# Patient Record
Sex: Female | Born: 1970 | Race: White | Hispanic: No | Marital: Married | Smoking: Never smoker
Health system: Southern US, Community
[De-identification: ages and names within clinical notes are randomized; demographics above are authoritative.]

## PROBLEM LIST (undated history)

## (undated) DIAGNOSIS — F419 Anxiety disorder, unspecified: Secondary | ICD-10-CM

## (undated) DIAGNOSIS — D649 Anemia, unspecified: Secondary | ICD-10-CM

## (undated) DIAGNOSIS — R635 Abnormal weight gain: Secondary | ICD-10-CM

## (undated) DIAGNOSIS — F329 Major depressive disorder, single episode, unspecified: Secondary | ICD-10-CM

## (undated) DIAGNOSIS — F32A Depression, unspecified: Secondary | ICD-10-CM

## (undated) HISTORY — PX: ABLATION: SHX5711

## (undated) HISTORY — DX: Anxiety disorder, unspecified: F41.9

## (undated) HISTORY — DX: Abnormal weight gain: R63.5

## (undated) HISTORY — PX: KNEE SURGERY: SHX244

## (undated) HISTORY — PX: TONSILLECTOMY AND ADENOIDECTOMY: SUR1326

## (undated) HISTORY — DX: Depression, unspecified: F32.A

## (undated) HISTORY — DX: Major depressive disorder, single episode, unspecified: F32.9

---

## 1998-09-10 ENCOUNTER — Other Ambulatory Visit: Admission: RE | Admit: 1998-09-10 | Discharge: 1998-09-10 | Payer: Self-pay | Admitting: Obstetrics and Gynecology

## 1999-10-01 ENCOUNTER — Inpatient Hospital Stay (HOSPITAL_COMMUNITY): Admission: AD | Admit: 1999-10-01 | Discharge: 1999-10-01 | Payer: Self-pay | Admitting: Obstetrics and Gynecology

## 1999-11-03 ENCOUNTER — Other Ambulatory Visit: Admission: RE | Admit: 1999-11-03 | Discharge: 1999-11-03 | Payer: Self-pay | Admitting: Obstetrics and Gynecology

## 1999-11-25 ENCOUNTER — Ambulatory Visit (HOSPITAL_COMMUNITY): Admission: RE | Admit: 1999-11-25 | Discharge: 1999-11-25 | Payer: Self-pay | Admitting: Obstetrics and Gynecology

## 1999-11-25 ENCOUNTER — Encounter: Payer: Self-pay | Admitting: Obstetrics and Gynecology

## 2000-01-20 ENCOUNTER — Other Ambulatory Visit: Admission: RE | Admit: 2000-01-20 | Discharge: 2000-01-20 | Payer: Self-pay | Admitting: Obstetrics and Gynecology

## 2000-08-03 ENCOUNTER — Inpatient Hospital Stay (HOSPITAL_COMMUNITY): Admission: AD | Admit: 2000-08-03 | Discharge: 2000-08-06 | Payer: Self-pay | Admitting: Obstetrics and Gynecology

## 2000-09-13 ENCOUNTER — Other Ambulatory Visit: Admission: RE | Admit: 2000-09-13 | Discharge: 2000-09-13 | Payer: Self-pay | Admitting: Obstetrics and Gynecology

## 2002-10-16 ENCOUNTER — Other Ambulatory Visit: Admission: RE | Admit: 2002-10-16 | Discharge: 2002-10-16 | Payer: Self-pay | Admitting: Obstetrics and Gynecology

## 2003-10-27 ENCOUNTER — Other Ambulatory Visit: Admission: RE | Admit: 2003-10-27 | Discharge: 2003-10-27 | Payer: Self-pay | Admitting: Obstetrics and Gynecology

## 2003-10-30 ENCOUNTER — Encounter: Admission: RE | Admit: 2003-10-30 | Discharge: 2003-10-30 | Payer: Self-pay | Admitting: Obstetrics and Gynecology

## 2004-08-03 ENCOUNTER — Ambulatory Visit: Payer: Self-pay

## 2004-09-01 ENCOUNTER — Encounter: Payer: Self-pay | Admitting: General Practice

## 2004-09-08 ENCOUNTER — Encounter: Payer: Self-pay | Admitting: General Practice

## 2004-10-06 ENCOUNTER — Encounter: Payer: Self-pay | Admitting: General Practice

## 2004-10-12 ENCOUNTER — Other Ambulatory Visit: Payer: Self-pay

## 2004-11-06 ENCOUNTER — Encounter: Payer: Self-pay | Admitting: General Practice

## 2004-12-01 ENCOUNTER — Other Ambulatory Visit: Admission: RE | Admit: 2004-12-01 | Discharge: 2004-12-01 | Payer: Self-pay | Admitting: Pain Medicine

## 2005-07-31 ENCOUNTER — Emergency Department: Payer: Self-pay | Admitting: Emergency Medicine

## 2005-07-31 ENCOUNTER — Other Ambulatory Visit: Payer: Self-pay

## 2006-09-18 ENCOUNTER — Ambulatory Visit: Payer: Self-pay | Admitting: Physician Assistant

## 2007-04-12 ENCOUNTER — Ambulatory Visit: Payer: Self-pay | Admitting: Internal Medicine

## 2007-04-27 ENCOUNTER — Ambulatory Visit: Payer: Self-pay | Admitting: Internal Medicine

## 2008-03-19 ENCOUNTER — Ambulatory Visit: Payer: Self-pay | Admitting: Obstetrics and Gynecology

## 2008-09-15 ENCOUNTER — Ambulatory Visit: Payer: Self-pay | Admitting: General Surgery

## 2009-03-24 ENCOUNTER — Ambulatory Visit: Payer: Self-pay | Admitting: General Surgery

## 2009-12-31 ENCOUNTER — Ambulatory Visit: Payer: Self-pay | Admitting: Internal Medicine

## 2010-04-08 ENCOUNTER — Ambulatory Visit: Payer: Self-pay | Admitting: General Surgery

## 2010-04-15 ENCOUNTER — Ambulatory Visit: Payer: Self-pay | Admitting: General Surgery

## 2011-04-19 ENCOUNTER — Ambulatory Visit (INDEPENDENT_AMBULATORY_CARE_PROVIDER_SITE_OTHER): Payer: PRIVATE HEALTH INSURANCE | Admitting: Internal Medicine

## 2011-04-19 ENCOUNTER — Encounter: Payer: Self-pay | Admitting: Internal Medicine

## 2011-04-19 VITALS — BP 110/72 | HR 79 | Temp 97.8°F | Resp 16 | Ht 63.75 in | Wt 182.0 lb

## 2011-04-19 DIAGNOSIS — E663 Overweight: Secondary | ICD-10-CM

## 2011-04-19 DIAGNOSIS — R5383 Other fatigue: Secondary | ICD-10-CM

## 2011-04-19 DIAGNOSIS — E669 Obesity, unspecified: Secondary | ICD-10-CM

## 2011-04-19 DIAGNOSIS — Z1239 Encounter for other screening for malignant neoplasm of breast: Secondary | ICD-10-CM

## 2011-04-19 DIAGNOSIS — R5381 Other malaise: Secondary | ICD-10-CM

## 2011-04-19 MED ORDER — CYANOCOBALAMIN 1000 MCG/ML IJ SOLN: 1000.0000 ug | INTRAMUSCULAR | Status: DC

## 2011-04-19 MED ORDER — SYRINGE (DISPOSABLE) 1 ML MISC: Status: DC

## 2011-04-19 MED ORDER — CYANOCOBALAMIN 1000 MCG/ML IJ SOLN
1000.0000 ug | Freq: Once | INTRAMUSCULAR | Status: AC
  Administered 2011-04-19: 1000 ug via INTRAMUSCULAR

## 2011-04-19 MED ORDER — PHENTERMINE HCL 37.5 MG PO TABS: 37.5000 mg | ORAL_TABLET | Freq: Every day | ORAL | Status: DC

## 2011-04-20 LAB — VITAMIN B12: Vitamin B-12: 181 pg/mL — ABNORMAL LOW (ref 211–911)

## 2011-04-21 ENCOUNTER — Encounter: Payer: Self-pay | Admitting: Internal Medicine

## 2011-04-21 DIAGNOSIS — Z1239 Encounter for other screening for malignant neoplasm of breast: Secondary | ICD-10-CM | POA: Insufficient documentation

## 2011-04-21 DIAGNOSIS — R5383 Other fatigue: Secondary | ICD-10-CM | POA: Insufficient documentation

## 2011-04-21 DIAGNOSIS — E669 Obesity, unspecified: Secondary | ICD-10-CM | POA: Insufficient documentation

## 2011-04-21 DIAGNOSIS — Z7689 Persons encountering health services in other specified circumstances: Secondary | ICD-10-CM | POA: Insufficient documentation

## 2011-04-21 NOTE — Assessment & Plan Note (Signed)
she is receiving regular screening through her gynecologists office.

## 2011-04-21 NOTE — Progress Notes (Signed)
  Subjective:    Patient ID: Sheri Guerrero, female    DOB: Jul 28, 1971, 40 y.o.   MRN: [id number]  HPI 40 yo white female with a history of depressive disorder, recently lost her mother to [place],  Presents with weight gain and increased depressive symptoms including malaise and insomnia.  She has in the past lost 60 lbs through diet and exercising and is currently doing both but not having the desired weight loss compared to previous time. She is interested in trying g B12 injections for energy and trying  an appetite suppressant which was prescribed in the past but never taken.  Review of Systems  Constitutional: Positive for appetite change, fatigue and unexpected weight change. Negative for fever and chills.  HENT: Negative for hearing loss, ear pain, nosebleeds, congestion, sore throat, facial swelling, rhinorrhea, sneezing, mouth sores, trouble swallowing, neck pain, neck stiffness, voice change, postnasal drip, sinus pressure, tinnitus and ear discharge.   Eyes: Negative for pain, discharge, redness and visual disturbance.  Respiratory: Negative for cough, chest tightness, shortness of breath, wheezing and stridor.   Cardiovascular: Negative for chest pain, palpitations and leg swelling.  Musculoskeletal: Negative for myalgias and arthralgias.  Skin: Negative for color change and rash.  Neurological: Negative for dizziness, weakness, light-headedness and headaches.  Hematological: Negative for adenopathy.  Psychiatric/Behavioral: Positive for sleep disturbance.   BP 110/72  Pulse 79  Temp(Src) 97.8 F (36.6 C) (Oral)  Resp 16  Ht 5' 3.75" (1.619 m)  Wt 182 lb (82.555 kg)  BMI 31.49 kg/m2  SpO2 99%  LMP 04/11/2011     Objective:   Physical Exam  Constitutional: She is oriented to person, place, and time. She appears well-developed and well-nourished.  HENT:  Mouth/Throat: Oropharynx is clear and moist.  Eyes: EOM are normal. Pupils are equal, round, and reactive to light. No  scleral icterus.  Neck: Normal range of motion. Neck supple. No JVD present. No thyromegaly present.  Cardiovascular: Normal rate, regular rhythm, normal heart sounds and intact distal pulses.   Pulmonary/Chest: Effort normal and breath sounds normal.  Abdominal: Soft. Bowel sounds are normal. She exhibits no mass. There is no tenderness.  Musculoskeletal: Normal range of motion. She exhibits no edema.  Lymphadenopathy:    She has no cervical adenopathy.  Neurological: She is alert and oriented to person, place, and time.  Skin: Skin is warm and dry.  Psychiatric: She has a normal mood and affect.          Assessment & Plan:

## 2011-04-21 NOTE — Assessment & Plan Note (Addendum)
Checking B12 and TSH.  No history of anemia.  If labs are normal she will consider resuming citalopram but currently not interested bc of the weight gain.

## 2011-04-21 NOTE — Assessment & Plan Note (Signed)
Failure to lose weight despite exercise due to increased appetite and fatigue. 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  Will check thyroid and b12 and initiate treatment as well as a short trial of phentermine.  Risks and benefits of stimulant medications discussed.

## 2011-04-26 ENCOUNTER — Ambulatory Visit: Payer: PRIVATE HEALTH INSURANCE

## 2011-05-03 ENCOUNTER — Ambulatory Visit (INDEPENDENT_AMBULATORY_CARE_PROVIDER_SITE_OTHER): Payer: PRIVATE HEALTH INSURANCE | Admitting: Internal Medicine

## 2011-05-03 DIAGNOSIS — E538 Deficiency of other specified B group vitamins: Secondary | ICD-10-CM

## 2011-05-03 MED ORDER — CYANOCOBALAMIN 1000 MCG/ML IJ SOLN: 1000.0000 ug | INTRAMUSCULAR | Status: DC

## 2011-05-03 MED ORDER — CYANOCOBALAMIN 1000 MCG/ML IJ SOLN
1000.0000 ug | Freq: Once | INTRAMUSCULAR | Status: AC
  Administered 2011-05-03: 1000 ug via INTRAMUSCULAR

## 2011-05-18 ENCOUNTER — Other Ambulatory Visit: Payer: Self-pay | Admitting: Internal Medicine

## 2011-05-19 ENCOUNTER — Encounter: Payer: Self-pay | Admitting: Internal Medicine

## 2011-05-19 MED ORDER — CITALOPRAM HYDROBROMIDE 10 MG PO TABS: 10.0000 mg | ORAL_TABLET | Freq: Every day | ORAL | Status: DC

## 2011-09-13 ENCOUNTER — Ambulatory Visit: Payer: Self-pay | Admitting: Physician Assistant

## 2011-09-21 ENCOUNTER — Other Ambulatory Visit: Payer: Self-pay | Admitting: Physician Assistant

## 2012-02-02 ENCOUNTER — Ambulatory Visit: Payer: PRIVATE HEALTH INSURANCE | Admitting: Internal Medicine

## 2012-03-16 ENCOUNTER — Other Ambulatory Visit: Payer: PRIVATE HEALTH INSURANCE

## 2012-03-26 ENCOUNTER — Encounter: Payer: Self-pay | Admitting: Internal Medicine

## 2012-03-26 ENCOUNTER — Ambulatory Visit (INDEPENDENT_AMBULATORY_CARE_PROVIDER_SITE_OTHER): Payer: PRIVATE HEALTH INSURANCE | Admitting: Internal Medicine

## 2012-03-26 VITALS — BP 120/70 | HR 86 | Temp 98.0°F | Resp 16 | Wt 185.2 lb

## 2012-03-26 DIAGNOSIS — E669 Obesity, unspecified: Secondary | ICD-10-CM

## 2012-03-26 DIAGNOSIS — K2990 Gastroduodenitis, unspecified, without bleeding: Secondary | ICD-10-CM

## 2012-03-26 DIAGNOSIS — E663 Overweight: Secondary | ICD-10-CM

## 2012-03-26 DIAGNOSIS — K2970 Gastritis, unspecified, without bleeding: Secondary | ICD-10-CM | POA: Insufficient documentation

## 2012-03-26 MED ORDER — PHENTERMINE HCL 37.5 MG PO TABS: 37.5000 mg | ORAL_TABLET | Freq: Every day | ORAL | Status: DC

## 2012-03-26 MED ORDER — DEXLANSOPRAZOLE 60 MG PO CPDR: 60.0000 mg | DELAYED_RELEASE_CAPSULE | Freq: Every day | ORAL | Status: DC

## 2012-03-26 NOTE — Assessment & Plan Note (Signed)
She had prior success with phentermine and is requesting brief resuming of medication to get started again.

## 2012-03-26 NOTE — Patient Instructions (Signed)
Abstain from all nonsteroidals and alcohol for two weeks (Advil,Alleve, celebrex, meloxicam, etc)  Ok to use gaviscon or mylanta for immediate relief of burning

## 2012-03-26 NOTE — Assessment & Plan Note (Signed)
NSAIDs vs H Pylori.  Serologies pending,  Trial of Dexilant, no NSAIDs or alcohol .  continue PPI for 6 weeks

## 2012-03-26 NOTE — Progress Notes (Signed)
Patient ID: Sheri Guerrero, female   DOB: 1970-08-31, 41 y.o.   MRN: [id number]  Patient Active Problem List  Diagnosis  . Obesity  . Fatigue  . Screening for breast cancer  . Gastritis    Subjective:  CC:   Chief Complaint  Patient presents with  . Follow-up  . Abdominal Pain    HPI:   Sheri Guerrero a 41 y.o. female who presents with recurrence  of gastritis sympotms for 4 months,  Resumed after resuming wellbutrin but also taking ibuprofren 800 mg every 1 or 2 days.    Stools brown.  Pain worst when stomach empty ,  No  specific food triggers.   2) had a  hypoglemic event sugar was 45 during recent  workout. Had not eaten that morning.     Past Medical History  Diagnosis Date  . Anxiety   . Abnormal weight gain   . Depression     Past Surgical History  Procedure Date  . Tonsillectomy and adenoidectomy age 65         The following portions of the patient's history were reviewed and updated as appropriate: Allergies, current medications, and problem list.    Review of Systems:   12 Pt  review of systems was negative except those addressed in the HPI,     History   Social History  . Marital Status: Married    Spouse Name: N/A    Number of Children: N/A  . Years of Education: N/A   Occupational History  . PT at [employer]     FT caregiver for parents   Social History Main Topics  . Smoking status: Never Smoker   . Smokeless tobacco: Never Used  . Alcohol Use: Yes     occassional, one a week  . Drug Use: No  . Sexually Active: Not on file   Other Topics Concern  . Not on file   Social History Narrative   Lives with spouse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    Objective:  BP 120/70  Pulse 86  Temp 98 F (36.7 C) (Oral)  Resp 16  Wt 185 lb 4 oz (84.029 kg)  SpO2 96%  LMP 03/12/2012  General appearance: alert, cooperative and appears stated age Ears: normal TM's and external ear canals both ears Throat: lips, mucosa, and tongue  normal; teeth and gums normal Neck: no adenopathy, no carotid bruit, supple, symmetrical, trachea midline and thyroid not enlarged, symmetric, no tenderness/mass/nodules Back: symmetric, no curvature. ROM normal. No CVA tenderness. Lungs: clear to auscultation bilaterally Heart: regular rate and rhythm, S1, S2 normal, no murmur, click, rub or gallop Abdomen: soft, tender in mid epigastric region without rebound or guarding; bowel sounds normal; no masses,  no organomegaly Pulses: 2+ and symmetric Skin: Skin color, texture, turgor normal. No rashes or lesions Lymph nodes: Cervical, supraclavicular, and axillary nodes normal.  Assessment and Plan: Gastritis NSAIDs vs H Pylori.  Serologies pending,  Trial of Dexilant, no NSAIDs or alcohol .  continue PPI for 6 weeks  Obesity She had prior success with phentermine and is requesting brief resuming of medication to get started again.    Updated Medication List Outpatient Encounter Prescriptions as of 03/26/2012  Medication Sig Dispense Refill  . cyanocobalamin (,VITAMIN B-12,) 1000 MCG/ML injection Inject 1 mL (1,000 mcg total) into the muscle every 30 (thirty) days.  10 mL  3  . doxycycline (VIBRAMYCIN) 100 MG capsule Take 100 mg by mouth daily.        Marland Kitchen  Multiple Vitamin (MULTIVITAMIN) tablet Take 1 tablet by mouth daily.        . Syringe, Disposable, 1 ML MISC Use new syringe with each B12 injection  30 each  0  . dexlansoprazole (DEXILANT) 60 MG capsule Take 1 capsule (60 mg total) by mouth daily.  30 capsule  2  . phentermine (ADIPEX-P) 37.5 MG tablet Take 1 tablet (37.5 mg total) by mouth daily before breakfast.  30 tablet  2  . DISCONTD: citalopram (CELEXA) 10 MG tablet Take 1 tablet (10 mg total) by mouth daily.  30 tablet  3  . DISCONTD: phentermine (ADIPEX-P) 37.5 MG tablet Take 1 tablet (37.5 mg total) by mouth daily before breakfast.  30 tablet  2

## 2012-03-27 ENCOUNTER — Other Ambulatory Visit: Payer: Self-pay | Admitting: Internal Medicine

## 2012-03-27 LAB — CBC WITH DIFFERENTIAL/PLATELET
Basophil #: 0 10*3/uL (ref 0.0–0.1)
Basophil %: 0.6 %
HCT: 36.5 % (ref 35.0–47.0)
MCH: 30.7 pg (ref 26.0–34.0)
MCHC: 34.2 g/dL (ref 32.0–36.0)
MCV: 90 fL (ref 80–100)
Monocyte #: 0.5 x10 3/mm (ref 0.2–0.9)
Monocyte %: 8 %
Neutrophil %: 60.5 %
Platelet: 207 10*3/uL (ref 150–440)
RBC: 4.07 10*6/uL (ref 3.80–5.20)

## 2012-03-27 LAB — LIPASE, BLOOD: Lipase: 173 U/L (ref 73–393)

## 2012-03-27 LAB — TSH: Thyroid Stimulating Horm: 0.907 u[IU]/mL

## 2012-03-28 ENCOUNTER — Telehealth: Payer: Self-pay | Admitting: Internal Medicine

## 2012-03-28 NOTE — Telephone Encounter (Signed)
Thyroid , cbc and hgba1c are all normal,.  No signs of diabetes, anemia or thyroid problems

## 2012-03-29 NOTE — Telephone Encounter (Signed)
Patient notified

## 2012-03-30 ENCOUNTER — Other Ambulatory Visit: Payer: Self-pay | Admitting: Internal Medicine

## 2012-04-06 ENCOUNTER — Encounter: Payer: Self-pay | Admitting: Internal Medicine

## 2012-05-16 ENCOUNTER — Ambulatory Visit: Payer: Self-pay | Admitting: General Practice

## 2012-06-06 ENCOUNTER — Telehealth: Payer: Self-pay | Admitting: Internal Medicine

## 2012-06-06 MED ORDER — CYANOCOBALAMIN 1000 MCG/ML IJ SOLN: 1000.0000 ug | INTRAMUSCULAR | Status: DC

## 2012-06-06 NOTE — Telephone Encounter (Signed)
Refill request for cyanocobalamin 1,000 mcg/10 ml  Sig: inject 1 ml (1,000 mcg total) into the muscle every 30 days

## 2012-06-06 NOTE — Telephone Encounter (Signed)
Sent to [facility] 

## 2013-02-04 ENCOUNTER — Encounter: Payer: Self-pay | Admitting: Internal Medicine

## 2013-02-04 ENCOUNTER — Ambulatory Visit (INDEPENDENT_AMBULATORY_CARE_PROVIDER_SITE_OTHER): Payer: 59 | Admitting: Internal Medicine

## 2013-02-04 VITALS — BP 102/66 | HR 74 | Temp 98.3°F | Resp 16 | Wt 185.8 lb

## 2013-02-04 DIAGNOSIS — E669 Obesity, unspecified: Secondary | ICD-10-CM

## 2013-02-04 DIAGNOSIS — E663 Overweight: Secondary | ICD-10-CM

## 2013-02-04 MED ORDER — PHENTERMINE HCL 37.5 MG PO TABS: 37.5000 mg | ORAL_TABLET | Freq: Every day | ORAL | Status: DC

## 2013-02-04 MED ORDER — CYANOCOBALAMIN 1000 MCG/ML IJ SOLN: 1000.0000 ug | INTRAMUSCULAR | Status: DC

## 2013-02-04 MED ORDER — DOXYCYCLINE HYCLATE 100 MG PO CAPS: 100.0000 mg | ORAL_CAPSULE | Freq: Every day | ORAL | Status: DC

## 2013-02-04 NOTE — Patient Instructions (Addendum)

## 2013-02-04 NOTE — Assessment & Plan Note (Addendum)
I have addressed  BMI and recommended a low glycemic index diet utilizing smaller more frequent meals to increase metabolism.  I have also recommended that patient start exercising with a goal of 30 minutes of aerobic exercise a minimum of 5 days per week. 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 Phentermine trial agreed upon.

## 2013-02-04 NOTE — Progress Notes (Signed)
Patient ID: Sheri Guerrero, female   DOB: [birth year], 42 y.o.   MRN: [id number]   Patient Active Problem List   Diagnosis Date Noted  . Gastritis 03/26/2012  . Obesity 04/21/2011  . Fatigue 04/21/2011  . Screening for breast cancer 04/21/2011    Subjective:  CC:   Chief Complaint  Patient presents with  . Follow-up    labs B12 C/O fatigue,    HPI:   Sheri Guerrero a 42 y.o. female who presents Follow up on fatigue, overweight and gastritis.  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  Sleeping better, but has been experiencing mid afternoon headaches daily for the past 3 weeks,  accompanied by sleepiness.  She has been skipping meals, not exercising and gaining weight.  She has recently addressed her lifestyle and has resumed a high protein diet .  She has increased appetite from 3 t o 7 pm and is requesting a trial of phentermine .    Past Medical History  Diagnosis Date  . Anxiety   . Abnormal weight gain   . Depression     Past Surgical History  Procedure Laterality Date  . Tonsillectomy and adenoidectomy  age 89       The following portions of the patient's history were reviewed and updated as appropriate: Allergies, current medications, and problem list.    Review of Systems:   12 Pt  review of systems was negative except those addressed in the HPI,     History   Social History  . Marital Status: Married    Spouse Name: N/A    Number of Children: N/A  . Years of Education: N/A   Occupational History  . PT at [employer]     FT caregiver for parents   Social History Main Topics  . Smoking status: Never Smoker   . Smokeless tobacco: Never Used  . Alcohol Use: Yes     Comment: occassional, one a week  . Drug Use: No  . Sexually Active: Not on file   Other Topics Concern  . Not on file   Social History Narrative   Lives with spouse   FT caregiver for parents who live  next door    Objective:  BP 102/66  Pulse 74  Temp(Src) 98.3 F (36.8 C) (Oral)  Resp 16  Wt 185 lb 12 oz (84.256 kg)  BMI 32.14 kg/m2  SpO2 99%  LMP 01/31/2013  General appearance: alert, cooperative and appears stated age Ears: normal TM's and external ear canals both ears Throat: lips, mucosa, and tongue normal; teeth and gums normal Neck: no adenopathy, no carotid bruit, supple, symmetrical, trachea midline and thyroid not enlarged, symmetric, no tenderness/mass/nodules Back: symmetric, no curvature. ROM normal. No CVA tenderness. Lungs: clear to auscultation bilaterally Heart: regular rate and rhythm, S1, S2 normal, no murmur, click, rub or gallop Abdomen: soft, non-tender; bowel sounds normal; no masses,  no organomegaly Pulses: 2+ and symmetric Skin: Skin color, texture, turgor normal. No rashes or lesions Lymph nodes: Cervical, supraclavicular, and axillary nodes normal.  Assessment and Plan:  Obesity I have addressed  BMI and recommended a low glycemic index diet utilizing smaller more frequent meals to increase metabolism.  I have also recommended that patient start exercising with a goal of 30 minutes of aerobic exercise a minimum of 5 days per week. 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  Phentermine trial agreed upon.    A total of 30 minutes of face to face time was spent with patient more than half of which was spent in counselling and coordination of care    Updated Medication List Outpatient Encounter Prescriptions as of 02/04/2013  Medication Sig Dispense Refill  . cyanocobalamin (,VITAMIN B-12,) 1000 MCG/ML injection Inject 1 mL (1,000 mcg total) into the muscle every 30 (thirty) days.  10 mL  3  . Syringe, Disposable, 1 ML MISC Use new syringe with each B12 injection  30 each  0  . [DISCONTINUED] cyanocobalamin (,VITAMIN B-12,) 1000 MCG/ML injection Inject 1 mL (1,000 mcg total) into the muscle every 30 (thirty) days.  10 mL  3  . dexlansoprazole  (DEXILANT) 60 MG capsule Take 1 capsule (60 mg total) by mouth daily.  30 capsule  2  . doxycycline (VIBRAMYCIN) 100 MG capsule Take 1 capsule (100 mg total) by mouth daily.  14 capsule  1  . Multiple Vitamin (MULTIVITAMIN) tablet Take 1 tablet by mouth daily.        . phentermine (ADIPEX-P) 37.5 MG tablet Take 1 tablet (37.5 mg total) by mouth daily before breakfast.  30 tablet  2  . phentermine (ADIPEX-P) 37.5 MG tablet Take 1 tablet (37.5 mg total) by mouth daily before breakfast.  30 tablet  2  . [DISCONTINUED] doxycycline (VIBRAMYCIN) 100 MG capsule Take 100 mg by mouth daily.        . [DISCONTINUED] phentermine (ADIPEX-P) 37.5 MG tablet Take 1 tablet (37.5 mg total) by mouth daily before breakfast.  30 tablet  2  . [DISCONTINUED] phentermine (ADIPEX-P) 37.5 MG tablet Take 1 tablet (37.5 mg total) by mouth daily before breakfast.  30 tablet  2   No facility-administered encounter medications on file as of 02/04/2013.     No orders of the defined types were placed in this encounter.    No Follow-up on file.

## 2013-04-29 ENCOUNTER — Ambulatory Visit (INDEPENDENT_AMBULATORY_CARE_PROVIDER_SITE_OTHER): Payer: 59 | Admitting: Adult Health

## 2013-04-29 ENCOUNTER — Encounter: Payer: Self-pay | Admitting: Adult Health

## 2013-04-29 VITALS — BP 110/68 | HR 84 | Temp 98.4°F | Resp 12 | Wt 184.5 lb

## 2013-04-29 DIAGNOSIS — E669 Obesity, unspecified: Secondary | ICD-10-CM

## 2013-04-29 DIAGNOSIS — J329 Chronic sinusitis, unspecified: Secondary | ICD-10-CM

## 2013-04-29 DIAGNOSIS — E663 Overweight: Secondary | ICD-10-CM

## 2013-04-29 MED ORDER — LEVOFLOXACIN 500 MG PO TABS: 500.0000 mg | ORAL_TABLET | Freq: Every day | ORAL | Status: DC

## 2013-04-29 MED ORDER — PHENTERMINE HCL 37.5 MG PO TABS: 37.5000 mg | ORAL_TABLET | Freq: Every day | ORAL | Status: DC

## 2013-04-29 MED ORDER — FLUCONAZOLE 150 MG PO TABS: 150.0000 mg | ORAL_TABLET | Freq: Once | ORAL | Status: DC

## 2013-04-29 NOTE — Progress Notes (Signed)
  Subjective:    Patient ID: Sheri Guerrero, female    DOB: 02/03/1971, 42 y.o.   MRN: [id number]  HPI  Pt reports sinus pressure and cough x 5 weeks.  Originally had productive cough with yellow sputum, sinus pressure, and fatigue for 2 weeks before seeing a NP at [facility].  Was started on Augmentin bid x 7 days which she completed. Pt states she felt some improvement but not complete symptom resolution.  [place] and began feeling worse when there.  Pt continues to have cough but now nonproductive, nasal congestion, and sore throat.  Pt reports history of pneumonia.   Also, patient reports that she had been prescribed phentermine in the past however she never took the medication. She is ready to begin a weight loss program and is requesting prescription for phentermine. She will begin this once her current symptoms resolve.   Current Outpatient Prescriptions on File Prior to Visit  Medication Sig Dispense Refill  . cyanocobalamin (,VITAMIN B-12,) 1000 MCG/ML injection Inject 1 mL (1,000 mcg total) into the muscle every 30 (thirty) days.  10 mL  3  . Multiple Vitamin (MULTIVITAMIN) tablet Take 1 tablet by mouth daily.        . Syringe, Disposable, 1 ML MISC Use new syringe with each B12 injection  30 each  0   No current facility-administered medications on file prior to visit.      Review of Systems  Constitutional: Positive for chills and fatigue.       Reports chills, unsure if she's had fever.  HENT: Positive for congestion, rhinorrhea and sinus pressure.   Eyes: Negative.   Respiratory: Positive for cough.   Cardiovascular: Negative.   Gastrointestinal: Positive for nausea.  Skin: Negative.   Neurological: Negative.   Psychiatric/Behavioral: Negative.        Objective:   Physical Exam  Constitutional: She appears well-developed and well-nourished.  HENT:  Head: Normocephalic.  Mouth/Throat: Oropharynx is clear and moist.  Neck:  Normal range of motion.  Cardiovascular: Normal rate.   Pulmonary/Chest: Effort normal and breath sounds normal.  Skin: Skin is warm and dry.  Psychiatric: She has a normal mood and affect. Her behavior is normal. Judgment and thought content normal.      Assessment & Plan:

## 2013-04-29 NOTE — Patient Instructions (Addendum)
Feel better soon!

## 2013-04-29 NOTE — Assessment & Plan Note (Signed)
Sinus symptoms ongoing for greater than 5 weeks. Completed course of Augmentin without complete resolution of symptoms. Start Levaquin 500 mg daily x10 days. RTC if no improvement in symptoms within 3-4 days at which point consider chest x-ray.

## 2013-04-29 NOTE — Assessment & Plan Note (Addendum)
Patient is requesting prescription for phentermine. This was ordered in the past however she never took medication. Phentermine ordered. She plans on beginning weight loss program once her current symptoms of sinusitis and chest congestion resolved.

## 2013-09-15 ENCOUNTER — Emergency Department: Payer: Self-pay | Admitting: Emergency Medicine

## 2013-10-10 ENCOUNTER — Ambulatory Visit: Payer: Self-pay | Admitting: General Practice

## 2013-10-15 ENCOUNTER — Ambulatory Visit: Payer: Self-pay | Admitting: General Practice

## 2014-04-18 LAB — LIPID PANEL
Cholesterol: 173 mg/dL (ref 0–200)
HDL: 48 mg/dL (ref 35–70)
LDL Cholesterol: 91 mg/dL

## 2014-05-06 ENCOUNTER — Encounter: Payer: Self-pay | Admitting: Internal Medicine

## 2014-05-06 ENCOUNTER — Ambulatory Visit (INDEPENDENT_AMBULATORY_CARE_PROVIDER_SITE_OTHER): Payer: 59 | Admitting: Internal Medicine

## 2014-05-06 VITALS — BP 108/76 | HR 78 | Temp 98.3°F | Resp 16 | Ht 64.0 in | Wt 189.0 lb

## 2014-05-06 DIAGNOSIS — E538 Deficiency of other specified B group vitamins: Secondary | ICD-10-CM

## 2014-05-06 DIAGNOSIS — E669 Obesity, unspecified: Secondary | ICD-10-CM

## 2014-05-06 DIAGNOSIS — Z23 Encounter for immunization: Secondary | ICD-10-CM

## 2014-05-06 DIAGNOSIS — R5381 Other malaise: Secondary | ICD-10-CM

## 2014-05-06 DIAGNOSIS — E663 Overweight: Secondary | ICD-10-CM

## 2014-05-06 DIAGNOSIS — R5383 Other fatigue: Secondary | ICD-10-CM

## 2014-05-06 MED ORDER — SYRINGE (DISPOSABLE) 1 ML MISC: Status: DC

## 2014-05-06 MED ORDER — CYANOCOBALAMIN 1000 MCG/ML IJ SOLN: INTRAMUSCULAR | Status: DC

## 2014-05-06 MED ORDER — PHENTERMINE HCL 37.5 MG PO TABS: ORAL_TABLET | ORAL | Status: DC

## 2014-05-06 NOTE — Patient Instructions (Signed)
I have authorized the use of phentermine for 3 months.  Please have your vital signs checked a week after starting, and return to see me in 3 months.tkae 1//2 tablet in the morning,  The second half by 2 PM to avoid insomnia If you have not lost  Lbs  by the end of the  3 months we will have to discontinue it.

## 2014-05-06 NOTE — Assessment & Plan Note (Signed)
I have addressed  BMI and recommended wt loss of 10% of body weigh over the next 6 months using a low glycemic index diet and regular exercise a minimum of 5 days per week. She has had difficulty losing weight due to increased appetite and is requesting a trial of  Phentermine.  She is aware of the possible side effects and risks and understands that    The medication will be discontinued if she has not lost 5% of her body weight over the next 3 months, which , based on today's weight is  9 lbs.  

## 2014-05-06 NOTE — Progress Notes (Signed)
Patient ID: Sheri Guerrero, female   DOB: 10/10/1970, 43 y.o.   MRN: [id number]   Patient Active Problem List   Diagnosis Date Noted  . B12 deficiency 05/06/2014  . Gastritis 03/26/2012  . Obesity 04/21/2011  . Fatigue 04/21/2011  . Screening for breast cancer 04/21/2011    Subjective:  CC:   Chief Complaint  Patient presents with  . Follow-up    B 12  medication refills    HPI:   Sheri Guerrero is a 43 y.o. female who presents for Follow up on chronic conditions, including B12 deficiency and obesity.   Obesity.   Recent health screening was done and patient was advised to follow up with me.  She was last seen a year ago and has gained 5 lbs.  She has been prescribed phentermine in the past but did not use it consistently.  She feels good and exercises regularlly but has an increased appetite.   B12 deficiency,  Diagnosed in 2012.  Patient notes that after 3 weeks she develops a feelig of head heaviness that resolves when she self administers B12. Refills needed,  Wondering if she can increase the frequency.    Past Medical History  Diagnosis Date  . Anxiety   . Abnormal weight gain   . Depression     Past Surgical History  Procedure Laterality Date  . Tonsillectomy and adenoidectomy  age 43       The following portions of the patient's history were reviewed and updated as appropriate: Allergies, current medications, and problem list.    Review of Systems:   Patient denies headache, fevers, malaise, unintentional weight loss, skin rash, eye pain, sinus congestion and sinus pain, sore throat, dysphagia,  hemoptysis , cough, dyspnea, wheezing, chest pain, palpitations, orthopnea, edema, abdominal pain, nausea, melena, diarrhea, constipation, flank pain, dysuria, hematuria, urinary  Frequency, nocturia, numbness, tingling, seizures,  Focal weakness, Loss of consciousness,  Tremor, insomnia, depression, anxiety, and suicidal ideation.     History   Social History   . Marital Status: Married    Spouse Name: N/A    Number of Children: N/A  . Years of Education: N/A   Occupational History  . 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 Smoking status: Never Smoker   . Smokeless tobacco: Never Used  . Alcohol Use: Yes     Comment: occassional, one a week  . Drug Use: No  . Sexual Activity: Not on file   Other Topics Concern  . 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 ROM normal. No CVA tenderness. Lungs: clear to auscultation bilaterally Heart: regular rate and rhythm, S1, S2 normal, no murmur, click, rub or gallop Abdomen: soft, non-tender; bowel sounds normal; no masses,  no organomegaly Pulses: 2+ and symmetric Skin: Skin color, texture, turgor normal. No rashes or lesions Lymph nodes: Cervical, supraclavicular, and axillary nodes normal.  Assessment and Plan:  B12 deficiency Documented in 2012,  With recurrent headaches occurring unless injectiions are given . Discussed frequenyc ,, can be increased to weekly to help her lose weight.    Obesity I have addressed  BMI and recommended wt loss of 10% of body weigh over the next 6 months using a low glycemic index diet and regular exercise a minimum of 5 days per week. She has had difficulty losing weight due to increased appetite and is requesting a trial of  Phentermine.  She is aware of the possible side effects and risks and understands  that    The medication will be discontinued if she has not lost 5% of her body weight over the next 3 months, which , based on today's weight is 9 lbs.    Updated Medication List Outpatient Encounter Prescriptions as of 05/06/2014  Medication Sig  . Multiple Vitamin (MULTIVITAMIN) tablet Take 1 tablet by mouth daily.    . Syringe, Disposable, 1 ML MISC Use new syringe with each B12 injection  . [DISCONTINUED] cyanocobalamin (,VITAMIN B-12,) 1000 MCG/ML injection Inject 1 mL (1,000 mcg total) into the muscle every 30 (thirty) days.  . [DISCONTINUED] Syringe, Disposable, 1 ML MISC Use new syringe with each B12 injection  . cyanocobalamin (,VITAMIN B-12,) 1000 MCG/ML injection For use with weekly B12 injections  . phentermine (ADIPEX-P) 37.5 MG tablet 1/2 tablet twice daily for appetite suppression  . [DISCONTINUED] fluconazole (DIFLUCAN) 150 MG tablet Take 1 tablet (150 mg total) by mouth once.  . [DISCONTINUED] levofloxacin (LEVAQUIN) 500 MG tablet Take 1 tablet (500 mg total) by mouth daily. Take 1 tablet daily for 10 days.  . [DISCONTINUED] phentermine (ADIPEX-P) 37.5 MG tablet Take 1 tablet (37.5 mg total) by mouth daily before breakfast.     Orders Placed This Encounter  Procedures  . CBC with Differential  . TSH  . Comprehensive metabolic panel    Return in about 3 months (around 08/05/2014).

## 2014-05-06 NOTE — Progress Notes (Signed)
Pre-visit discussion using our clinic review tool. No additional management support is needed unless otherwise documented below in the visit note.  

## 2014-05-06 NOTE — Assessment & Plan Note (Signed)
Documented in 2012,  With recurrent headaches occurring unless injectiions are given . Discussed frequenyc ,, can be increased to weekly to help her lose weight.

## 2014-05-07 LAB — CBC WITH DIFFERENTIAL/PLATELET
BASOS ABS: 0.1 10*3/uL (ref 0.0–0.1)
Basophils Relative: 1.1 % (ref 0.0–3.0)
EOS ABS: 0.5 10*3/uL (ref 0.0–0.7)
Eosinophils Relative: 5.9 % — ABNORMAL HIGH (ref 0.0–5.0)
HCT: 35.8 % — ABNORMAL LOW (ref 36.0–46.0)
HEMOGLOBIN: 11.9 g/dL — AB (ref 12.0–15.0)
LYMPHS PCT: 30.7 % (ref 12.0–46.0)
Lymphs Abs: 2.7 10*3/uL (ref 0.7–4.0)
MCHC: 33.2 g/dL (ref 30.0–36.0)
MCV: 84.6 fl (ref 78.0–100.0)
MONOS PCT: 6.6 % (ref 3.0–12.0)
Monocytes Absolute: 0.6 10*3/uL (ref 0.1–1.0)
NEUTROS ABS: 5 10*3/uL (ref 1.4–7.7)
NEUTROS PCT: 55.7 % (ref 43.0–77.0)
PLATELETS: 255 10*3/uL (ref 150.0–400.0)
RBC: 4.24 Mil/uL (ref 3.87–5.11)
RDW: 15 % (ref 11.5–15.5)
WBC: 8.9 10*3/uL (ref 4.0–10.5)

## 2014-05-07 LAB — COMPREHENSIVE METABOLIC PANEL
ALBUMIN: 4.4 g/dL (ref 3.5–5.2)
ALK PHOS: 57 U/L (ref 39–117)
ALT: 16 U/L (ref 0–35)
AST: 21 U/L (ref 0–37)
BUN: 15 mg/dL (ref 6–23)
CALCIUM: 9.2 mg/dL (ref 8.4–10.5)
CHLORIDE: 103 meq/L (ref 96–112)
CO2: 25 mEq/L (ref 19–32)
Creatinine, Ser: 0.8 mg/dL (ref 0.4–1.2)
GFR: 90 mL/min (ref >60.00)
Glucose, Bld: 70 mg/dL (ref 70–99)
POTASSIUM: 4.1 meq/L (ref 3.5–5.1)
SODIUM: 136 meq/L (ref 135–145)
TOTAL PROTEIN: 7.9 g/dL (ref 6.0–8.3)
Total Bilirubin: 0.5 mg/dL (ref 0.2–1.2)

## 2014-05-07 LAB — TSH: TSH: 1.16 u[IU]/mL (ref 0.35–4.50)

## 2014-05-08 ENCOUNTER — Encounter: Payer: Self-pay | Admitting: *Deleted

## 2014-07-01 IMAGING — MG MAM DGTL SCRN MAM NO ORDER W/CAD
1 series · 4 of 4 positions shown · non-contrast
Comparison: Previous Exam(s)

CLINICAL DATA: Screening.

EXAM:
DIGITAL SCREENING BILATERAL MAMMOGRAM WITH CAD

[R CC · right · 4 of 4 slices shown]
[im 1/4]
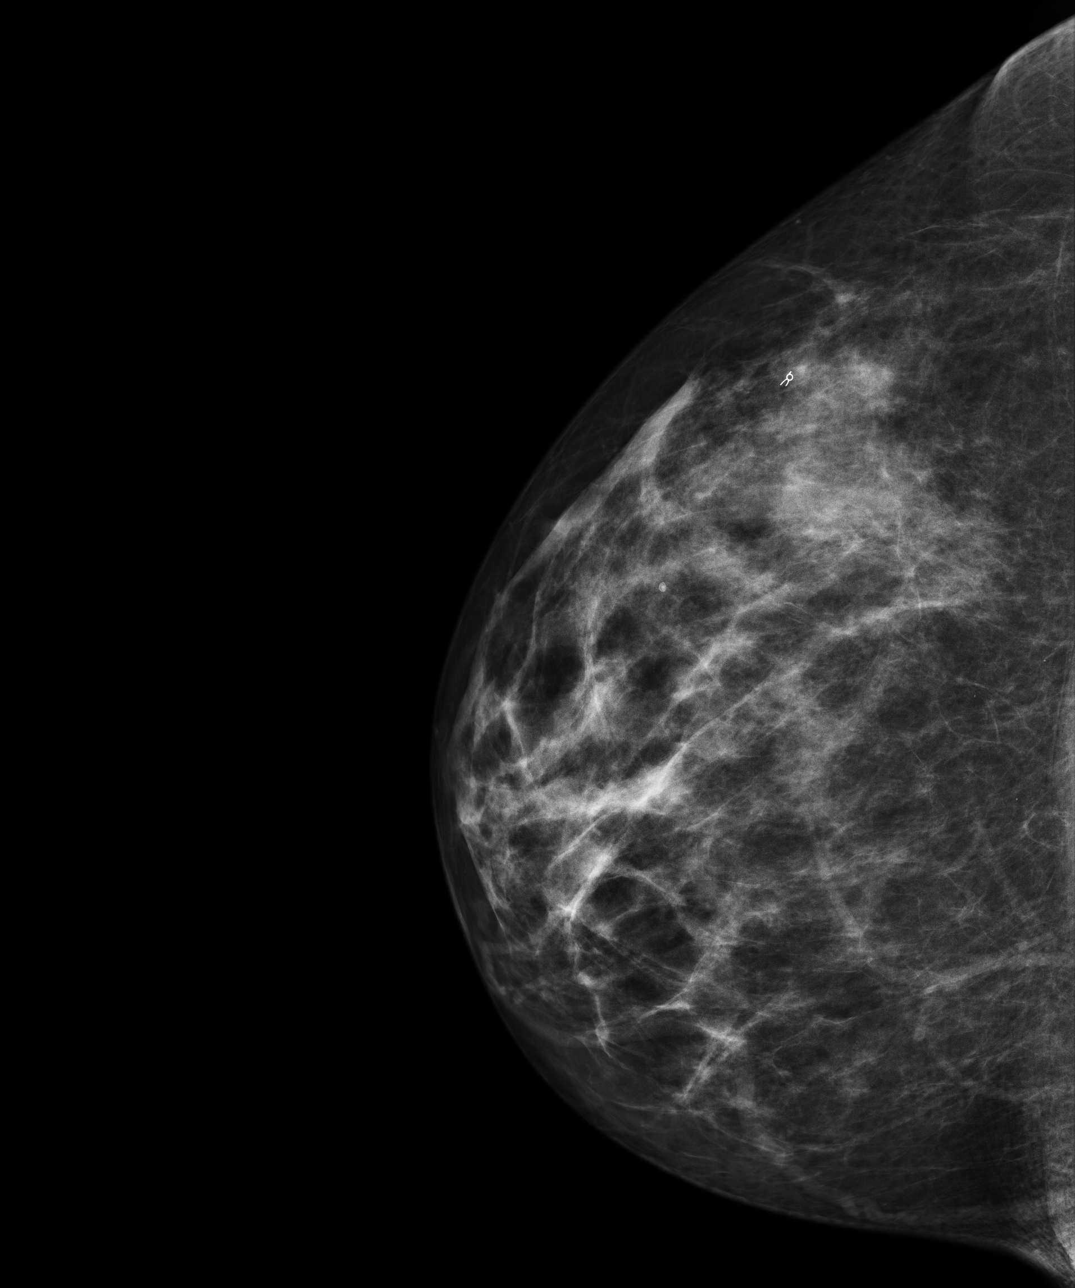
[im 2/4]
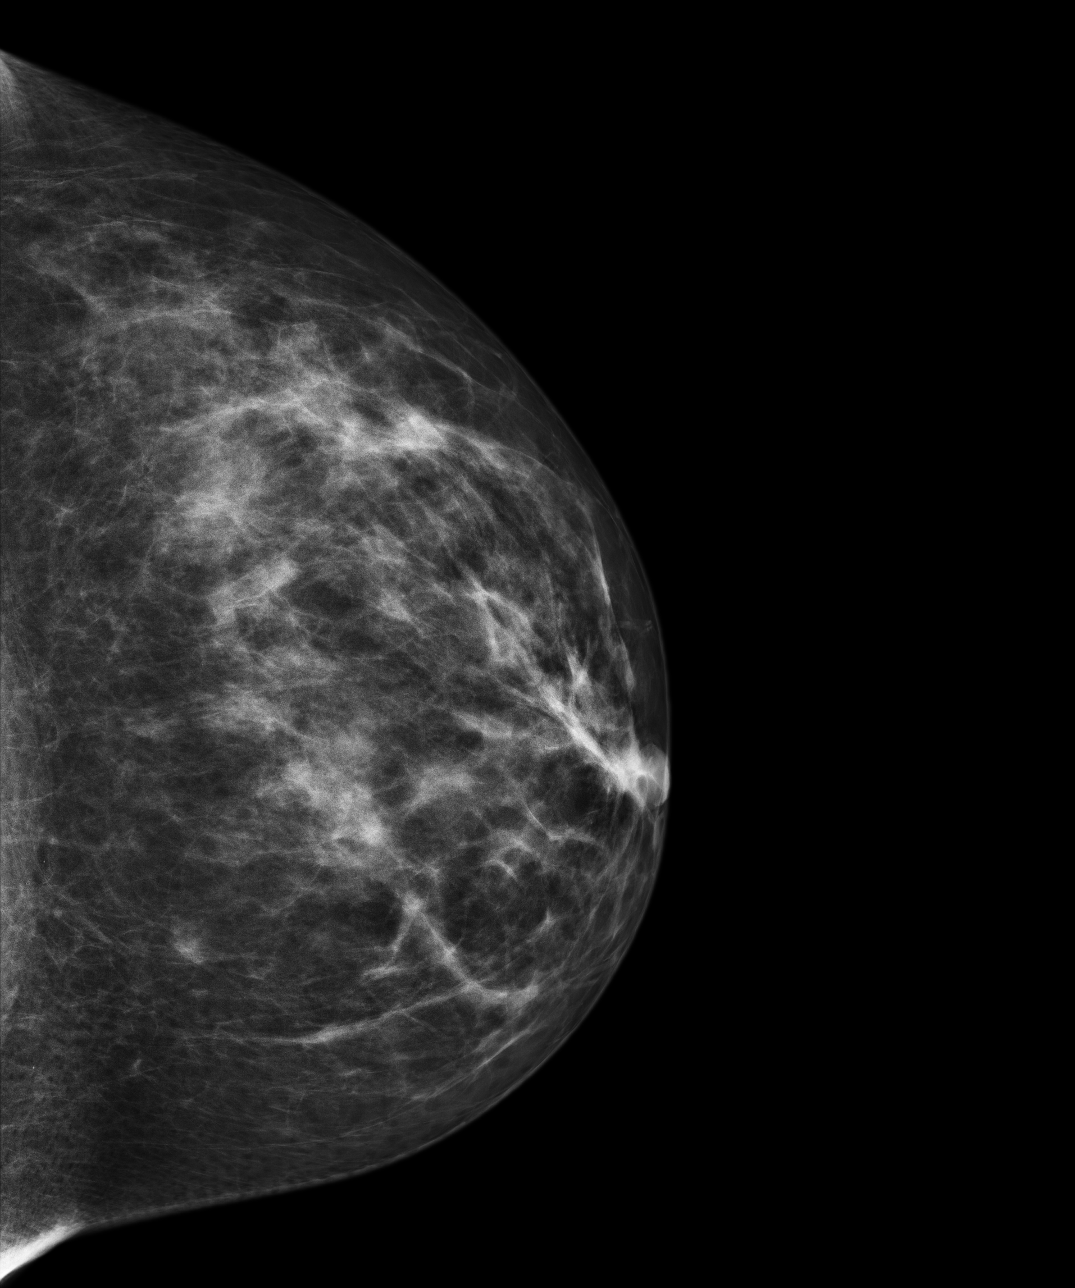
[im 3/4]
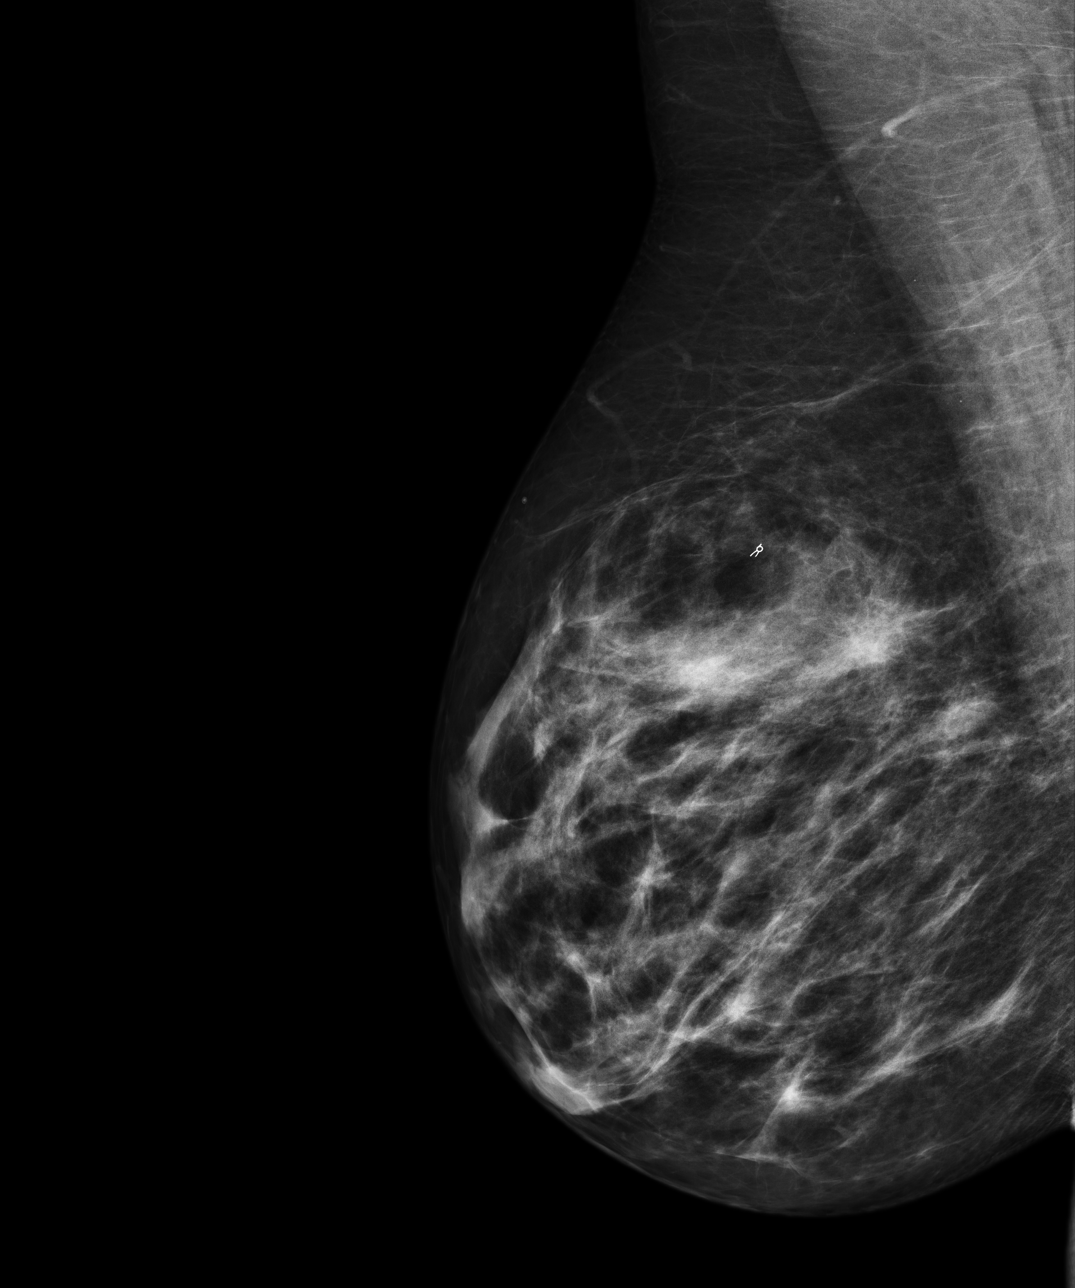
[im 4/4]
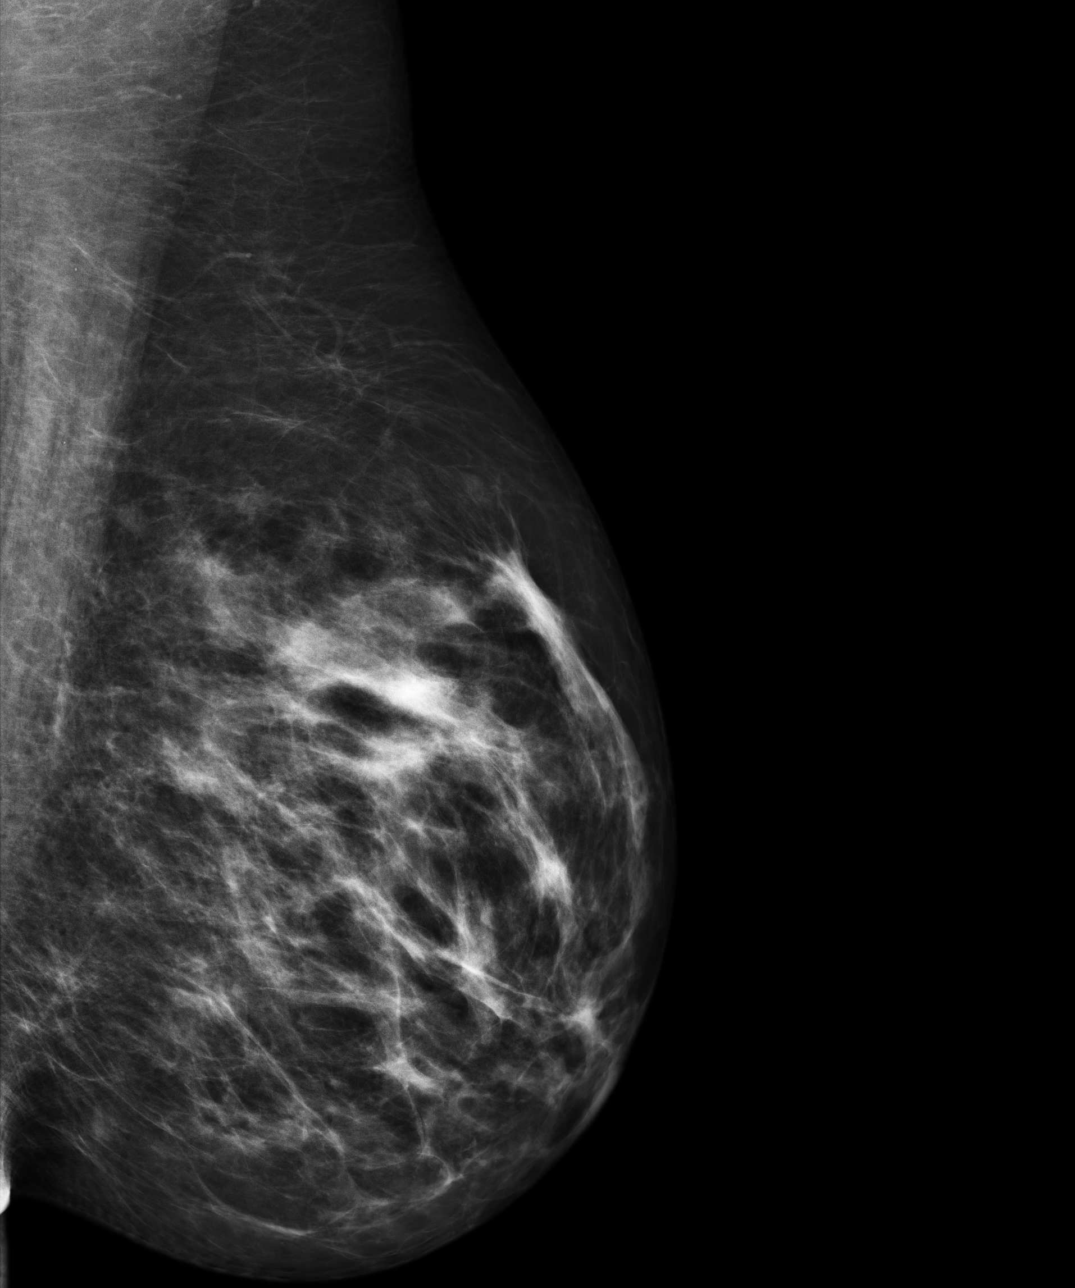

[4 of 4 positions shown; findings below may reference images not displayed]

ACR Breast Density Category c: The breast tissue is heterogeneously
dense, which may obscure small masses.
FINDINGS: In the right breast, a possible asymmetry warrants further
evaluation with spot compression views and possibly ultrasound. In
the left breast, no findings suspicious for malignancy. Images were
processed with CAD.
IMPRESSION: Further evaluation is suggested for possible asymmetry in the right
breast.

RECOMMENDATION:
Diagnostic mammogram and possibly ultrasound of the right breast.
(Code:HZ-X-88H)

The patient will be contacted regarding the findings, and additional
imaging will be scheduled.

BI-RADS CATEGORY  0: Incomplete. Need additional imaging evaluation
and/or prior mammograms for comparison.

## 2014-10-22 ENCOUNTER — Ambulatory Visit: Payer: Self-pay | Admitting: Internal Medicine

## 2014-10-22 LAB — HM MAMMOGRAPHY: HM MAMMO: NEGATIVE

## 2015-03-13 LAB — LIPID PANEL
Cholesterol: 187 mg/dL (ref 0–200)
HDL: 52 mg/dL (ref 35–70)
LDL CALC: 106 mg/dL
Triglycerides: 144 mg/dL (ref 40–160)

## 2015-03-13 LAB — HEMOGLOBIN A1C: HEMOGLOBIN A1C: 5.5

## 2015-06-29 ENCOUNTER — Other Ambulatory Visit: Payer: Self-pay | Admitting: Internal Medicine

## 2015-08-11 ENCOUNTER — Ambulatory Visit: Payer: 59 | Admitting: Internal Medicine

## 2015-08-24 ENCOUNTER — Ambulatory Visit: Payer: 59 | Admitting: Internal Medicine

## 2015-08-28 ENCOUNTER — Ambulatory Visit (INDEPENDENT_AMBULATORY_CARE_PROVIDER_SITE_OTHER): Payer: 59 | Admitting: Internal Medicine

## 2015-08-28 ENCOUNTER — Encounter: Payer: Self-pay | Admitting: Internal Medicine

## 2015-08-28 VITALS — BP 110/76 | HR 79 | Temp 98.5°F | Resp 12 | Ht 64.0 in | Wt 191.0 lb

## 2015-08-28 DIAGNOSIS — E538 Deficiency of other specified B group vitamins: Secondary | ICD-10-CM | POA: Diagnosis not present

## 2015-08-28 DIAGNOSIS — E559 Vitamin D deficiency, unspecified: Secondary | ICD-10-CM | POA: Diagnosis not present

## 2015-08-28 DIAGNOSIS — Z1159 Encounter for screening for other viral diseases: Secondary | ICD-10-CM

## 2015-08-28 DIAGNOSIS — E663 Overweight: Secondary | ICD-10-CM | POA: Diagnosis not present

## 2015-08-28 DIAGNOSIS — E669 Obesity, unspecified: Secondary | ICD-10-CM

## 2015-08-28 MED ORDER — CIPROFLOXACIN HCL 500 MG PO TABS: 250.0000 mg | ORAL_TABLET | Freq: Two times a day (BID) | ORAL | Status: DC

## 2015-08-28 MED ORDER — VALACYCLOVIR HCL 1 G PO TABS: ORAL_TABLET | ORAL | Status: DC

## 2015-08-28 MED ORDER — CYANOCOBALAMIN 1000 MCG/ML IJ SOLN: INTRAMUSCULAR | Status: DC

## 2015-08-28 MED ORDER — PHENTERMINE HCL 37.5 MG PO TABS: ORAL_TABLET | ORAL | Status: DC

## 2015-08-28 MED ORDER — PROMETHAZINE HCL 12.5 MG RE SUPP: 12.5000 mg | Freq: Four times a day (QID) | RECTAL | Status: DC | PRN

## 2015-08-28 NOTE — Progress Notes (Signed)
Pre-visit discussion using our clinic review tool. No additional management support is needed unless otherwise documented below in the visit note.  

## 2015-08-28 NOTE — Progress Notes (Signed)
Subjective:  Patient ID: Sheri Guerrero, female    DOB: [birth year]  Age: 45 y.o. MRN: [id number]  CC: The primary encounter diagnosis was Overweight. Diagnoses of B12 deficiency, Vitamin D deficiency, Need for hepatitis C screening test, and Obesity were also pertinent to this visit.  HPI Sheri Guerrero presents for follow up on b12 deficiency and obesity . Last seen Sept 2015.   Mammogram normal  3d October 22 2014  1) OBESITY: She has BEEN EXERCISING 4 TO 5 TIMES  PER WEEK .  She has had difficulty losing weight due to increased appetite and is requesting a trial of  Phentermine.  She is aware of the possible side effects and risks Reviewed her previous success at weight loss,  Her goal weight for BMI < 30 (174 lbs).   2) b12 DEFICIENCY:  SHE HAS not given herself a B12 injection in over 6 months   Outpatient Prescriptions Prior to Visit  Medication Sig Dispense Refill  . Multiple Vitamin (MULTIVITAMIN) tablet Take 1 tablet by mouth daily.      . Syringe, Disposable, 1 ML MISC Use new syringe with each B12 injection 25 each 2  . cyanocobalamin (,VITAMIN B-12,) 1000 MCG/ML injection For use with weekly B12 injections 25 mL 2  . phentermine (ADIPEX-P) 37.5 MG tablet 1/2 tablet twice daily for appetite suppression (Patient not taking: Reported on 08/28/2015) 30 tablet 2   No facility-administered medications prior to visit.    Review of Systems;  Patient denies headache, fevers, malaise, unintentional weight loss, skin rash, eye pain, sinus congestion and sinus pain, sore throat, dysphagia,  hemoptysis , cough, dyspnea, wheezing, chest pain, palpitations, orthopnea, edema, abdominal pain, nausea, melena, diarrhea, constipation, flank pain, dysuria, hematuria, urinary  Frequency, nocturia, numbness, tingling, seizures,  Focal weakness, Loss of consciousness,  Tremor, insomnia, depression, anxiety, and suicidal ideation.      Objective:  BP 110/76 mmHg  Pulse 79  Temp(Src) 98.5 F  (36.9 C) (Oral)  Resp 12  Ht  (1.626 m)  Wt 191 lb (86.637 kg)  BMI 32.77 kg/m2  SpO2 100%  LMP 08/27/2015 (Approximate)  BP Readings from Last 3 Encounters:  08/28/15 110/76  05/06/14 108/76  04/29/13 110/68    Wt Readings from Last 3 Encounters:  08/28/15 191 lb (86.637 kg)  05/06/14 189 lb (85.73 kg)  04/29/13 184 lb 8 oz (83.689 kg)    General appearance: alert, cooperative and appears stated age Ears: normal TM's and external ear canals both ears Throat: lips, mucosa, and tongue normal; teeth and gums normal Neck: no adenopathy, no carotid bruit, supple, symmetrical, trachea midline and thyroid not enlarged, symmetric, no tenderness/mass/nodules Back: symmetric, no curvature. ROM normal. No CVA tenderness. Lungs: clear to auscultation bilaterally Heart: regular rate and rhythm, S1, S2 normal, no murmur, click, rub or gallop Abdomen: soft, non-tender; bowel sounds normal; no masses,  no organomegaly Pulses: 2+ and symmetric Skin: Skin color, texture, turgor normal. No rashes or lesions Lymph nodes: Cervical, supraclavicular, and axillary nodes normal.  Lab Results  Component Value Date   HGBA1C 5.4 03/27/2012    Lab Results  Component Value Date   CREATININE 0.8 05/06/2014    Lab Results  Component Value Date   WBC 8.9 05/06/2014   HGB 11.9* 05/06/2014   HCT 35.8* 05/06/2014   PLT 255.0 05/06/2014   GLUCOSE 70 05/06/2014   CHOL 173 04/18/2014   HDL 48 04/18/2014   LDLCALC 91 04/18/2014   ALT 16 05/06/2014  AST 21 05/06/2014   NA 136 05/06/2014   K 4.1 05/06/2014   CL 103 05/06/2014   CREATININE 0.8 05/06/2014   BUN 15 05/06/2014   CO2 25 05/06/2014   TSH 1.16 05/06/2014   HGBA1C 5.4 03/27/2012    No results found.  Assessment & Plan:   Problem List Items Addressed This Visit    Obesity    Reviewed her previous success at weight loss,  Her goal weight for BMI < 30 (174 lbs). I have addressed  BMI and recommended wt loss of 10% of body  weight over the next 6 months using a low glycemic index diet,  regular exercise a minimum of 5 days per week, and appetite suppression, per her request,  With phentermine . She has had difficulty losing weight due to increased appetite and is requesting a trial of  Phentermine.  She is aware of the possible side effects and risks and understands that    The medication will be discontinued if she has not lost 5% of her body weight over the next 3 months, which , based on today's weight is 10 lbs.       Relevant Medications   phentermine (ADIPEX-P) 37.5 MG tablet   B12 deficiency    Advised to resume B12 injections daily x 3,  then weekly x 3 then biweekly.       Relevant Orders   CBC with Differential/Platelet   Vitamin B12    Other Visit Diagnoses    Overweight    -  Primary    Relevant Medications    phentermine (ADIPEX-P) 37.5 MG tablet    Other Relevant Orders    Comprehensive metabolic panel    TSH    Lipid panel    Vitamin D deficiency        Relevant Orders    VITAMIN D 25 Hydroxy (Vit-D Deficiency, Fractures)    Need for hepatitis C screening test        Relevant Orders    Hepatitis C antibody       I have changed Ms. Cantara's cyanocobalamin. I am also having her start on ciprofloxacin and promethazine. Additionally, I am having her maintain her multivitamin, Syringe (Disposable), valACYclovir, and phentermine.  Meds ordered this encounter  Medications  . DISCONTD: valACYclovir (VALTREX) 1000 MG tablet    Sig: TAKE 2 TABLETS BY MOUTH TWICE DAILY AS NEEDED FOR COLD SORES    Refill:  5  . ciprofloxacin (CIPRO) 500 MG tablet    Sig: Take 0.5 tablets (250 mg total) by mouth 2 (two) times daily.    Dispense:  14 tablet    Refill:  0  . promethazine (PHENERGAN) 12.5 MG suppository    Sig: Place 1 suppository (12.5 mg total) rectally every 6 (six) hours as needed for nausea or vomiting.    Dispense:  12 each    Refill:  0  . valACYclovir (VALTREX) 1000 MG tablet     Sig: TAKE 2 TABLETS BY MOUTH TWICE DAILY AS NEEDED FOR COLD SORES    Dispense:  20 tablet    Refill:  5  . phentermine (ADIPEX-P) 37.5 MG tablet    Sig: 1/2 tablet twice daily for appetite suppression    Dispense:  30 tablet    Refill:  2  . cyanocobalamin (,VITAMIN B-12,) 1000 MCG/ML injection    Sig: For use with weekly B12 injections  PLEASE FILL WITH 25 ML VIAL    Dispense:  25 mL  Refill:  2   A total of 25 minutes of face to face time was spent with patient more than half of which was spent in counselling about the above mentioned conditions  and coordination of care  Medications Discontinued During This Encounter  Medication Reason  . valACYclovir (VALTREX) 1000 MG tablet Reorder  . phentermine (ADIPEX-P) 37.5 MG tablet Reorder  . cyanocobalamin (,VITAMIN B-12,) 1000 MCG/ML injection Reorder    Follow-up: No Follow-up on file.   [name], MD

## 2015-08-28 NOTE — Patient Instructions (Signed)
The  diet I discussed with you today is the 10 day Green Smoothie Cleansing /Detox Diet by Brooke Dare . available on [employer] for around $10.  This is not a low carb or a weight loss diet,  It is fundamentally a "cleansing" low fat diet that eliminates sugar, gluten, caffeine, alcohol and dairy for 10 days .  What you add back after the initial ten days is entirely up to  you!  You can expect to lose 5 to 10 lbs depending on how strict you are.   I found that  drinking 2 smoothies or juices  daily and keeping one chewable meal (but keep it simple, like baked fish and salad, rice or bok choy) kept me satisfied and kept me from straying  .  You snack primarily on fresh  fruit, egg whites and judicious quantities of nuts. I  Recommend adding a  vegetable based protein powder  To any smoothie made with almond milk.  (nothing with whey , since whey is dairy) in it.  [employer] has a [employer] .   It does require some form of a nutrient extractor (Vita Mix, a electric juicer,  Or a Nutribullet Rx).  i have found that using frozen fruits is much more convenient and cost effective. You can even find plenty of organic fruit in the frozen fruit section of [employer].  Just thaw what you need for the following day the night before in the refrigerator (to avoid jamming up your machine)    The organic greens drink I use if I don't have any fresh greens  is called "Suja" and it's sold in the vegetable refrigerated section of most grocery stores (including [employer])  . It is tart, though, so be careful (has lemon juice in it )

## 2015-08-30 NOTE — Assessment & Plan Note (Signed)
Advised to resume B12 injections daily x 3,  then weekly x 3 then biweekly.

## 2015-08-30 NOTE — Assessment & Plan Note (Signed)
Reviewed her previous success at weight loss,  Her goal weight for BMI < 30 (174 lbs). I have addressed  BMI and recommended wt loss of 10% of body weight over the next 6 months using a low glycemic index diet,  regular exercise a minimum of 5 days per week, and appetite suppression, per her request,  With phentermine . She has had difficulty losing weight due to increased appetite and is requesting a trial of  Phentermine.  She is aware of the possible side effects and risks and understands that    The medication will be discontinued if she has not lost 5% of her body weight over the next 3 months, which , based on today's weight is 10 lbs.

## 2015-09-08 ENCOUNTER — Encounter: Payer: Self-pay | Admitting: Internal Medicine

## 2016-02-12 ENCOUNTER — Telehealth: Payer: Self-pay | Admitting: *Deleted

## 2016-02-12 NOTE — Telephone Encounter (Signed)
Pt. Stated that she was given a diet for protein shakes however she misplaced the diet plan, and has requested another plan. [place]. Pt contact [phone]

## 2016-02-12 NOTE — Telephone Encounter (Signed)
Do you have a secondary copy we can place up front for the patient?

## 2016-02-12 NOTE — Telephone Encounter (Signed)
Patient has picked up a copy.

## 2016-06-06 ENCOUNTER — Telehealth: Payer: Self-pay | Admitting: *Deleted

## 2016-06-06 NOTE — Telephone Encounter (Signed)
I have nothing available this week except 1:15 on Friday ,  But Sena HitchMargaret Arnette , my NP may be able to see her this week

## 2016-06-06 NOTE — Telephone Encounter (Signed)
Patient requested to see Dr. Darrick Huntsmanullo only for a d=itchy patch above her eye, she can only be seen on Thursday or Friday  Please give time and date  Pt contact [phone]

## 2016-06-07 ENCOUNTER — Telehealth: Payer: Self-pay | Admitting: Internal Medicine

## 2016-06-07 NOTE — Telephone Encounter (Signed)
Left voice message to call office to get scheduled

## 2016-06-07 NOTE — Telephone Encounter (Signed)
Patient seen by Eye doctor for rash above eye. FYI flu shot updated in chart.

## 2016-06-07 NOTE — Telephone Encounter (Signed)
Please schedule with Claris CheMargaret, thanks.

## 2016-06-07 NOTE — Telephone Encounter (Signed)
Pt called to let you know that she went to her eye doctor yesterday to take care of her issue, also wanted to let you know that she got her flu shot done as [facility]. Thank you!

## 2016-07-15 ENCOUNTER — Other Ambulatory Visit: Payer: Self-pay | Admitting: Internal Medicine

## 2016-07-15 DIAGNOSIS — R5383 Other fatigue: Secondary | ICD-10-CM

## 2016-09-03 ENCOUNTER — Other Ambulatory Visit: Payer: Self-pay | Admitting: Internal Medicine

## 2016-09-05 NOTE — Telephone Encounter (Signed)
Pt last refill on Vitamin B12 was on 08/09/16, Pt last labs were on 03/13/15 and did have labs drawn when ordered on 08/30/15. Pt last OV was on 08/28/15 and has no future scheduled appts.

## 2016-09-05 NOTE — Telephone Encounter (Signed)
Refills denied until she has the labs done that I ordered a year ago

## 2016-11-14 ENCOUNTER — Ambulatory Visit (INDEPENDENT_AMBULATORY_CARE_PROVIDER_SITE_OTHER): Payer: 59 | Admitting: Internal Medicine

## 2016-11-14 ENCOUNTER — Encounter: Payer: Self-pay | Admitting: Internal Medicine

## 2016-11-14 VITALS — BP 104/68 | HR 82 | Temp 98.6°F | Resp 16 | Ht 64.0 in | Wt 182.0 lb

## 2016-11-14 DIAGNOSIS — E7800 Pure hypercholesterolemia, unspecified: Secondary | ICD-10-CM | POA: Diagnosis not present

## 2016-11-14 DIAGNOSIS — R7611 Nonspecific reaction to tuberculin skin test without active tuberculosis: Secondary | ICD-10-CM | POA: Diagnosis not present

## 2016-11-14 DIAGNOSIS — Z1159 Encounter for screening for other viral diseases: Secondary | ICD-10-CM | POA: Diagnosis not present

## 2016-11-14 DIAGNOSIS — R7301 Impaired fasting glucose: Secondary | ICD-10-CM

## 2016-11-14 DIAGNOSIS — Z111 Encounter for screening for respiratory tuberculosis: Secondary | ICD-10-CM

## 2016-11-14 DIAGNOSIS — E559 Vitamin D deficiency, unspecified: Secondary | ICD-10-CM

## 2016-11-14 DIAGNOSIS — E538 Deficiency of other specified B group vitamins: Secondary | ICD-10-CM | POA: Diagnosis not present

## 2016-11-14 DIAGNOSIS — E669 Obesity, unspecified: Secondary | ICD-10-CM

## 2016-11-14 DIAGNOSIS — Z0000 Encounter for general adult medical examination without abnormal findings: Secondary | ICD-10-CM

## 2016-11-14 DIAGNOSIS — Z9289 Personal history of other medical treatment: Secondary | ICD-10-CM

## 2016-11-14 NOTE — Progress Notes (Signed)
Pre visit review using our clinic review tool, if applicable. No additional management support is needed unless otherwise documented below in the visit note. 

## 2016-11-14 NOTE — Progress Notes (Signed)
Patient ID: Sheri Guerrero, female    DOB: November 15, 1970  Age: 46 y.o. MRN: [id number]  The patient is here for annual wellness examination and follow up on chronic problems.  Last seen Jan 2017     PAP and breast exam were done 3 weeks ago  By [facility]  (3d mammo done)  By  Richarda Overlie , MD  Needs TB Quantiferon test due to false positive  Tb skin test in there past  Weight is down 10 lbs from last visit .  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   Taking b12 injections 1-2 per month     The risk factors are reflected in the social history.  The roster of all physicians providing medical care to patient - is listed in the Snapshot section of the chart.  Home safety : The patient has smoke detectors in the home. They wear seatbelts.  There are no firearms at home. There is no violence in the home.   There is no risks for hepatitis, STDs or HIV. There is no   history of blood transfusion. They have no travel history to infectious disease endemic areas of the world.  The patient has seen their dentist in the last six month. They have seen their eye doctor in the last year. They do not  have excessive sun exposure. Discussed the need for sun protection: hats, long sleeves and use of sunscreen if there is significant sun exposure.   Diet: the importance of a healthy diet is discussed.  She has a  healthy diet.  The benefits of regular aerobic exercise were discussed. She runs 3  times per week ,  30 minutes.   Depression screen: there are no signs or vegative symptoms of depression- irritability, change in appetite, anhedonia, sadness/tearfullness.  The following portions of the patient's history were reviewed and updated as appropriate: allergies, current medications, past family history, past medical history,  past  surgical history, past social history  and problem list.  Visual acuity was not assessed per patient preference since she has regular follow up with her ophthalmologist. Hearing and body mass index were assessed and reviewed.   During the course of the visit the patient was educated and counseled about appropriate screening and preventive services including : fall prevention , diabetes screening, nutrition counseling, colorectal cancer screening, and recommended immunizations.    CC: The primary encounter diagnosis was Impaired fasting glucose. Diagnoses of Pure hypercholesterolemia, B12 deficiency, Encounter for hepatitis C screening test for low risk patient, Vitamin D deficiency, Screening-pulmonary TB, Obesity (BMI 30.0-34.9), Encounter for preventive health examination, and History of positive PPD were also pertinent to this visit.  History Sheri Guerrero has a past medical history of Abnormal weight gain; Anxiety; and Depression.   She has a past surgical history that includes Tonsillectomy and adenoidectomy (age 8).   Her family history includes Cancer in her mother and paternal grandmother.She reports that she has never smoked. She has never used smokeless tobacco. She reports that she drinks alcohol. She reports that she does not use drugs.  Outpatient Medications Prior to Visit  Medication Sig Dispense Refill  . B-D INSULIN SYRINGE 1CC/25GX1" 25G X 1" 1 ML MISC USE NEW SYRINGE WITH EACH B12 INJECTION 100 each 1  . cyanocobalamin (,VITAMIN B-12,) 1000 MCG/ML  injection For use with weekly B12 injections  PLEASE FILL WITH 25 ML VIAL 25 mL 2  . Multiple Vitamin (MULTIVITAMIN) tablet Take 1 tablet by mouth daily.      . valACYclovir (VALTREX) 1000 MG tablet TAKE 2 TABLETS BY MOUTH TWICE DAILY AS NEEDED FOR COLD SORES 20 tablet 5   No facility-administered medications prior to visit.     Review of Systems   Patient denies headache, fevers, malaise, unintentional weight loss, skin rash, eye  pain, sinus congestion and sinus pain, sore throat, dysphagia,  hemoptysis , cough, dyspnea, wheezing, chest pain, palpitations, orthopnea, edema, abdominal pain, nausea, melena, diarrhea, constipation, flank pain, dysuria, hematuria, urinary  Frequency, nocturia, numbness, tingling, seizures,  Focal weakness, Loss of consciousness,  Tremor, insomnia, depression, anxiety, and suicidal ideation.      Objective:  BP 104/68   Pulse 82   Temp 98.6 F (37 C) (Oral)   Resp 16   Ht  (1.626 m)   Wt 182 lb (82.6 kg)   SpO2 98%   BMI 31.24 kg/m   Physical Exam   General appearance: alert, cooperative and appears stated age Head: Normocephalic, without obvious abnormality, atraumatic Eyes: conjunctivae/corneas clear. PERRL, EOM's intact. Fundi benign. Ears: normal TM's and external ear canals both ears Nose: Nares normal. Septum midline. Mucosa normal. No drainage or sinus tenderness. Throat: lips, mucosa, and tongue normal; teeth and gums normal Neck: no adenopathy, no carotid bruit, no JVD, supple, symmetrical, trachea midline and thyroid not enlarged, symmetric, no tenderness/mass/nodules Lungs: clear to auscultation bilaterally Heart: regular rate and rhythm, S1, S2 normal, no murmur, click, rub or gallop Abdomen: soft, non-tender; bowel sounds normal; no masses,  no organomegaly Extremities: extremities normal, atraumatic, no cyanosis or edema Pulses: 2+ and symmetric Skin: Skin color, texture, turgor normal. No rashes or lesions Neurologic: Alert and oriented X 3, normal strength and tone. Normal symmetric reflexes. Normal coordination and gait.     Assessment & Plan:   Problem List Items Addressed This Visit    B12 deficiency    Managed with  Biweekly injections.    Lab Results  Component Value Date   VITAMINB12 376 11/14/2016         Relevant Orders   Vitamin B12 (Completed)   Encounter for preventive health examination    Annual comprehensive preventive exam was  done as well as an evaluation and management of chronic conditions .  During the course of the visit the patient was educated and counseled about appropriate screening and preventive services including :  diabetes screening, lipid analysis with projected  10 year  risk for CAD , nutrition counseling, breast, cervical and colorectal cancer screening, and recommended immunizations.  Printed recommendations for health maintenance screenings was given       History of positive PPD    False positive .  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  I haver ecommended using a low glycemic index diet and regular exercise a minimum of 5 days per week.   Lab Results  Component Value Date   HGBA1C 5.7 11/14/2016         Relevant Orders   Hemoglobin A1c (Completed)   Comprehensive metabolic panel (Completed)   Obesity (BMI 30.0-34.9)    I have congratulated her in reduction of   BMI and encouraged  Continued weight loss  with goal of 10% of body weigh over the next 6 months using a low glycemic index diet and regular exercise a minimum of 5 days per week.         Other Visit Diagnoses    Pure hypercholesterolemia       Relevant Orders   LDL cholesterol, direct (Completed)   Lipid panel (Completed)   Encounter for hepatitis C screening test for low risk patient       Relevant Orders   Hepatitis C antibody (Completed)   Vitamin D deficiency       Relevant Orders   VITAMIN D 25 Hydroxy (Vit-D Deficiency, Fractures) (Completed)   Screening-pulmonary TB       Relevant Orders   Quantiferon tb gold assay (blood)      I am having Ms. Sheri Guerrero maintain her multivitamin, valACYclovir, cyanocobalamin, and B-D INSULIN SYRINGE 1CC/25GX1".  No orders of the defined types were placed in this encounter.   There are no discontinued medications.  Follow-up: No Follow-up on  file.   [name], MD

## 2016-11-15 LAB — COMPREHENSIVE METABOLIC PANEL
ALT: 14 U/L (ref 0–35)
AST: 14 U/L (ref 0–37)
Albumin: 4.5 g/dL (ref 3.5–5.2)
Alkaline Phosphatase: 70 U/L (ref 39–117)
BILIRUBIN TOTAL: 0.4 mg/dL (ref 0.2–1.2)
BUN: 11 mg/dL (ref 6–23)
CALCIUM: 9.2 mg/dL (ref 8.4–10.5)
CO2: 27 mEq/L (ref 19–32)
CREATININE: 0.66 mg/dL (ref 0.40–1.20)
Chloride: 105 mEq/L (ref 96–112)
GFR: 103.08 mL/min (ref >60.00)
GLUCOSE: 90 mg/dL (ref 70–99)
Potassium: 4 mEq/L (ref 3.5–5.1)
Sodium: 137 mEq/L (ref 135–145)
Total Protein: 7.3 g/dL (ref 6.0–8.3)

## 2016-11-15 LAB — LIPID PANEL
CHOL/HDL RATIO: 3
Cholesterol: 191 mg/dL (ref 0–200)
HDL: 59.6 mg/dL (ref >39.00)
LDL CALC: 103 mg/dL — AB (ref 0–99)
NONHDL: 131.54
Triglycerides: 143 mg/dL (ref 0.0–149.0)
VLDL: 28.6 mg/dL (ref 0.0–40.0)

## 2016-11-15 LAB — VITAMIN D 25 HYDROXY (VIT D DEFICIENCY, FRACTURES): VITD: 34.16 ng/mL (ref 30.00–100.00)

## 2016-11-15 LAB — HEMOGLOBIN A1C: HEMOGLOBIN A1C: 5.7 % (ref 4.6–6.5)

## 2016-11-15 LAB — LDL CHOLESTEROL, DIRECT: Direct LDL: 109 mg/dL

## 2016-11-15 LAB — HEPATITIS C ANTIBODY: HCV Ab: NEGATIVE

## 2016-11-15 LAB — VITAMIN B12: VITAMIN B 12: 376 pg/mL (ref 211–911)

## 2016-11-16 ENCOUNTER — Telehealth: Payer: Self-pay | Admitting: *Deleted

## 2016-11-16 DIAGNOSIS — Z9289 Personal history of other medical treatment: Secondary | ICD-10-CM | POA: Insufficient documentation

## 2016-11-16 DIAGNOSIS — R7301 Impaired fasting glucose: Secondary | ICD-10-CM | POA: Insufficient documentation

## 2016-11-16 DIAGNOSIS — Z0000 Encounter for general adult medical examination without abnormal findings: Secondary | ICD-10-CM | POA: Insufficient documentation

## 2016-11-16 NOTE — Assessment & Plan Note (Addendum)
Annual comprehensive preventive exam was done as well as an evaluation and management of chronic conditions .  During the course of the visit the patient was educated and counseled about appropriate screening and preventive services including :  diabetes screening, lipid analysis with projected  10 year  risk for CAD , nutrition counseling, breast, cervical and colorectal cancer screening, and recommended immunizations.  Printed recommendations for health maintenance screenings was given 

## 2016-11-16 NOTE — Assessment & Plan Note (Signed)
a1c was done to assess risk for developing diabetes.  I haver ecommended using a low glycemic index diet and regular exercise a minimum of 5 days per week.   Lab Results  Component Value Date   HGBA1C 5.7 11/14/2016

## 2016-11-16 NOTE — Assessment & Plan Note (Signed)
False positive .  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

## 2016-11-16 NOTE — Assessment & Plan Note (Signed)
I have congratulated her in reduction of   BMI and encouraged  Continued weight loss with goal of 10% of body weigh over the next 6 months using a low glycemic index diet and regular exercise a minimum of 5 days per week.    

## 2016-11-16 NOTE — Assessment & Plan Note (Signed)
Managed with  Biweekly injections.    Lab Results  Component Value Date   VITAMINB12 376 11/14/2016

## 2016-11-16 NOTE — Telephone Encounter (Signed)
Patient has requested her office after visit summery from 11/14/16, along with her lab results Pt contact [phone]

## 2016-11-17 ENCOUNTER — Telehealth: Payer: Self-pay

## 2016-11-17 LAB — QUANTIFERON TB GOLD ASSAY (BLOOD)
INTERFERON GAMMA RELEASE ASSAY: NEGATIVE
Mitogen-Nil: 9.92 IU/mL
Quantiferon Nil Value: 0.04 IU/mL
Quantiferon Tb Ag Minus Nil Value: 0.01 IU/mL

## 2016-11-17 NOTE — Telephone Encounter (Signed)
Pt called back returning your call. Pt states she needs a after summary paper and labs in writing. Please and thank you!  Call pt @ [phone].

## 2016-11-17 NOTE — Telephone Encounter (Signed)
-----   Message from [name], MD sent at 11/16/2016  8:15 PM EDT ----- Your cholesterol, vitamin d,  liver and kidney function are normal.  Your a1c is at the upper limits of normal (at 5.7. At 5.8 we call you a " prediabetic") so keep exercising, avoiding starches  and losiong weight .  Your quantiferon gold test is still pending   Sheri Guerrero

## 2016-11-17 NOTE — Telephone Encounter (Signed)
LMTCB. Need to lt pt know that her after visit summary and copy of labs have been placed up front for pick up.

## 2016-11-17 NOTE — Telephone Encounter (Signed)
Spoke with pt and she would rather [place] mail them to her once we get the tb results back.

## 2016-11-17 NOTE — Telephone Encounter (Signed)
LMTCB regarding lab results.  

## 2016-11-17 NOTE — Telephone Encounter (Signed)
Spoke with pt and informed her of her lab results. Also let pt know that AVS and labs have been put up front for pick up. The pt asked if we would just mail them to her once we got the tb results back.

## 2016-11-21 NOTE — Telephone Encounter (Signed)
Error with sending. I meant to send to Elijio Miles. Thank you!

## 2016-11-21 NOTE — Telephone Encounter (Signed)
Pt called to check if one of her TB lab came back? And pt would like everything to mailed please. Please advise?  Call pt @ [phone]. Thank you!

## 2016-11-21 NOTE — Telephone Encounter (Signed)
I spoke with the patient, gave TB results, can you please add to your prior labs and AVS to mail to patient today, thanks

## 2016-11-21 NOTE — Telephone Encounter (Signed)
Dr.Tullo patient  

## 2016-11-22 NOTE — Telephone Encounter (Signed)
AVS and labs have been mailed to the pt per pt's request.

## 2017-01-13 ENCOUNTER — Other Ambulatory Visit: Payer: Self-pay | Admitting: Internal Medicine

## 2018-03-12 ENCOUNTER — Other Ambulatory Visit: Payer: Self-pay | Admitting: Internal Medicine

## 2018-03-12 DIAGNOSIS — R5383 Other fatigue: Secondary | ICD-10-CM

## 2018-03-14 ENCOUNTER — Ambulatory Visit (INDEPENDENT_AMBULATORY_CARE_PROVIDER_SITE_OTHER): Payer: Managed Care, Other (non HMO) | Admitting: Internal Medicine

## 2018-03-14 ENCOUNTER — Encounter: Payer: Self-pay | Admitting: Internal Medicine

## 2018-03-14 ENCOUNTER — Telehealth: Payer: Self-pay | Admitting: *Deleted

## 2018-03-14 VITALS — BP 110/74 | HR 64 | Temp 98.6°F | Resp 15 | Ht 64.0 in | Wt 195.4 lb

## 2018-03-14 DIAGNOSIS — E669 Obesity, unspecified: Secondary | ICD-10-CM | POA: Diagnosis not present

## 2018-03-14 DIAGNOSIS — R7301 Impaired fasting glucose: Secondary | ICD-10-CM

## 2018-03-14 DIAGNOSIS — J0100 Acute maxillary sinusitis, unspecified: Secondary | ICD-10-CM

## 2018-03-14 DIAGNOSIS — J320 Chronic maxillary sinusitis: Secondary | ICD-10-CM

## 2018-03-14 DIAGNOSIS — R5383 Other fatigue: Secondary | ICD-10-CM | POA: Diagnosis not present

## 2018-03-14 MED ORDER — CYANOCOBALAMIN 1000 MCG/ML IJ SOLN: INTRAMUSCULAR | 1 refills | Status: DC

## 2018-03-14 MED ORDER — AMOXICILLIN-POT CLAVULANATE 875-125 MG PO TABS: 1.0000 | ORAL_TABLET | Freq: Two times a day (BID) | ORAL | 0 refills | Status: DC

## 2018-03-14 MED ORDER — PREDNISONE 10 MG PO TABS
ORAL_TABLET | ORAL | 0 refills | Status: DC
Start: 2018-03-14 — End: 2018-03-20

## 2018-03-14 MED ORDER — "INSULIN SYRINGE-NEEDLE U-100 25G X 1"" 1 ML MISC": 1 refills | Status: DC

## 2018-03-14 MED ORDER — FLUCONAZOLE 150 MG PO TABS: 150.0000 mg | ORAL_TABLET | Freq: Every day | ORAL | 0 refills | Status: DC

## 2018-03-14 NOTE — Progress Notes (Signed)
Subjective:  Patient ID: Sheri Guerrero, female    DOB: 1970/11/29  Age: 47 y.o. MRN: [id number]  CC: The primary encounter diagnosis was Chronic maxillary sinusitis. Diagnoses of Other fatigue and Acute maxillary sinusitis, recurrence not specified were also pertinent to this visit.  HPI Sheri Guerrero presents for evaluation and treatment of a respiratory infection that started  3 weeks ago.  She reports headache and  nonproductive cough,  And over the last week has developed chest tightness,  Bilateral maxillary sinus pain/tooth pain and ear   pressure .   Outpatient Medications Prior to Visit  Medication Sig Dispense Refill  . Multiple Vitamin (MULTIVITAMIN) tablet Take 1 tablet by mouth daily.      . valACYclovir (VALTREX) 1000 MG tablet TAKE 2 TABLETS BY MOUTH TWICE DAILY AS NEEDED FOR COLD SORES 20 tablet 5  . B-D INSULIN SYRINGE 1CC/25GX1" 25G X 1" 1 ML MISC USE NEW SYRINGE WITH EACH B12 INJECTION 100 each 1  . cyanocobalamin (,VITAMIN B-12,) 1000 MCG/ML injection FOR USE WITH WEEKLY B12 INJECTIONS 25 mL 1   No facility-administered medications prior to visit.     Review of Systems;  Patient deniesfevers,  unintentional weight loss, skin rash, eye pain,, sore throat, dysphagia,  hemoptysis ,, dyspnea, wheezing, chest pain, palpitations, orthopnea, edema, abdominal pain, nausea, melena, diarrhea, constipation, flank pain, dysuria, hematuria, urinary  Frequency, nocturia, numbness, tingling, seizures,  Focal weakness, Loss of consciousness,  Tremor, insomnia, depression, anxiety, and suicidal ideation.      Objective:  BP 110/74 (BP Location: Left Arm, Patient Position: Sitting, Cuff Size: Normal)   Pulse 64   Temp 98.6 F (37 C) (Oral)   Resp 15   Ht 5\' 4"  (1.626 m)   Wt 195 lb 6.4 oz (88.6 kg)   SpO2 98%   BMI 33.54 kg/m   BP Readings from Last 3 Encounters:  03/14/18 110/74  11/14/16 104/68  08/28/15 110/76    Wt Readings from Last 3 Encounters:  03/14/18 195  lb 6.4 oz (88.6 kg)  11/14/16 182 lb (82.6 kg)  08/28/15 191 lb (86.6 kg)    General appearance: alert, cooperative and appears stated age Ears: dull  TM's bilaterally,   external ear canals both ears Throat: lips, mucosa, and tongue normal; teeth and gums normal FAce: bilateral maxillary sinus tenderness  Neck: no adenopathy, no carotid bruit, supple, symmetrical, trachea midline and thyroid not enlarged, symmetric, no tenderness/mass/nodules Back: symmetric, no curvature. ROM normal. No CVA tenderness. Lungs: clear to auscultation bilaterally Heart: regular rate and rhythm, S1, S2 normal, no murmur, click, rub or gallop Abdomen: soft, non-tender; bowel sounds normal; no masses,  no organomegaly Pulses: 2+ and symmetric Skin: Skin color, texture, turgor normal. No rashes or lesions Lymph nodes: Cervical, supraclavicular, and axillary nodes normal.  Lab Results  Component Value Date   HGBA1C 5.7 11/14/2016   HGBA1C 5.5 03/13/2015   HGBA1C 5.4 03/27/2012    Lab Results  Component Value Date   CREATININE 0.66 11/14/2016   CREATININE 0.8 05/06/2014    Lab Results  Component Value Date   WBC 8.9 05/06/2014   HGB 11.9 (L) 05/06/2014   HCT 35.8 (L) 05/06/2014   PLT 255.0 05/06/2014   GLUCOSE 90 11/14/2016   CHOL 191 11/14/2016   TRIG 143.0 11/14/2016   HDL 59.60 11/14/2016   LDLDIRECT 109.0 11/14/2016   LDLCALC 103 (H) 11/14/2016   ALT 14 11/14/2016   AST 14 11/14/2016   NA 137 11/14/2016   K 4.0  11/14/2016   CL 105 11/14/2016   CREATININE 0.66 11/14/2016   BUN 11 11/14/2016   CO2 27 11/14/2016   TSH 1.16 05/06/2014   HGBA1C 5.7 11/14/2016   Assessment & Plan:   Problem List Items Addressed This Visit    Fatigue   Relevant Medications   Insulin Syringe-Needle U-100 (B-D INSULIN SYRINGE 1CC/25GX1") 25G X 1" 1 ML MISC   Sinusitis, acute maxillary    Given chronicity of symptoms, development of facial pain and exam consistent with bacterial URI,  Will treat with  empiric antibiotics, decongestants, and saline lavage.  Adding steroid taper for chest tightness and cough       Relevant Medications   amoxicillin-clavulanate (AUGMENTIN) 875-125 MG tablet   predniSONE (DELTASONE) 10 MG tablet   fluconazole (DIFLUCAN) 150 MG tablet    Other Visit Diagnoses    Chronic maxillary sinusitis    -  Primary   Relevant Medications   amoxicillin-clavulanate (AUGMENTIN) 875-125 MG tablet   predniSONE (DELTASONE) 10 MG tablet   fluconazole (DIFLUCAN) 150 MG tablet      I have changed Tiea Feig's B-D INSULIN SYRINGE 1CC/25GX1" to Insulin Syringe-Needle U-100. I am also having her start on amoxicillin-clavulanate, predniSONE, and fluconazole. Additionally, I am having her maintain her multivitamin, valACYclovir, and cyanocobalamin.  Meds ordered this encounter  Medications  . amoxicillin-clavulanate (AUGMENTIN) 875-125 MG tablet    Sig: Take 1 tablet by mouth 2 (two) times daily.    Dispense:  14 tablet    Refill:  0  . predniSONE (DELTASONE) 10 MG tablet    Sig: 6 tablets on Day 1 , then reduce by 1 tablet daily until gone    Dispense:  21 tablet    Refill:  0  . fluconazole (DIFLUCAN) 150 MG tablet    Sig: Take 1 tablet (150 mg total) by mouth daily.    Dispense:  2 tablet    Refill:  0  . cyanocobalamin (,VITAMIN B-12,) 1000 MCG/ML injection    Sig: FOR USE WITH WEEKLY B12 INJECTIONS    Dispense:  25 mL    Refill:  1  . Insulin Syringe-Needle U-100 (B-D INSULIN SYRINGE 1CC/25GX1") 25G X 1" 1 ML MISC    Sig: USE NEW SYRINGE WITH EACH B12 INJECTION    Dispense:  100 each    Refill:  1    Medications Discontinued During This Encounter  Medication Reason  . cyanocobalamin (,VITAMIN B-12,) 1000 MCG/ML injection Reorder  . B-D INSULIN SYRINGE 1CC/25GX1" 25G X 1" 1 ML MISC Reorder    Follow-up: No follow-ups on file.   Sherlene Shamseresa L Kyheem Bathgate, MD

## 2018-03-14 NOTE — Telephone Encounter (Signed)
Copied from CRM [id number]. Topic: General - Other >> Mar 14, 2018 11:50 AM Gaynelle AduPoole, Shalonda wrote: Reason for CRM: Patient is requesting a  appt with  Dr.tullo today. Advise of the schedule of different  providers  but no avaiablity today. She is needing to be seen for  being Congestive in her chest  . She is requesting a call back. Please advise

## 2018-03-14 NOTE — Telephone Encounter (Signed)
LMTCB. Need to schedule pt an office visit before anymore refills. P[facility] may speak with pt.

## 2018-03-14 NOTE — Patient Instructions (Signed)
I am treating you for sinusitis/otitis    I am prescribing an antibiotic (augmentin) and a prednisone taper  To manage the infection and the inflammation in your ear/sinuses.   I also advise use of the following OTC meds to help with your other symptoms.   Take generic OTC benadryl 25 mg every 8 hours for the drainage,  Sudafed PE  10 to 30 mg every 8 hours for the congestion, you may substitute Afrin nasal spray for the nighttime dose of sudafed PE  If needed to prevent insomnia.    You should try [facility]'s Sinus rinse ;  It is a strong sinus "flush" using water and medicated salts.  Do it over the sink because it can be a bit messy

## 2018-03-17 ENCOUNTER — Encounter: Payer: Self-pay | Admitting: Internal Medicine

## 2018-03-17 DIAGNOSIS — J0100 Acute maxillary sinusitis, unspecified: Secondary | ICD-10-CM | POA: Insufficient documentation

## 2018-03-17 NOTE — Assessment & Plan Note (Signed)
Given chronicity of symptoms, development of facial pain and exam consistent with bacterial URI,  Will treat with empiric antibiotics, decongestants, and saline lavage.  Adding steroid taper for chest tightness and cough

## 2018-03-17 NOTE — Assessment & Plan Note (Signed)
I have addressed  BMI and recommended wt loss of 10% of body weight over the next 6 months using a low glycemic index diet and regular exercise a minimum of 5 days per week.   

## 2018-03-17 NOTE — Assessment & Plan Note (Signed)
a1c was upper end of normal last year.  Done done to assess risk for developing diabetes.  I have again recommended using a low glycemic index diet and regular exercise a minimum of 5 days per week.   Lab Results  Component Value Date   HGBA1C 5.7 11/14/2016

## 2018-03-20 ENCOUNTER — Encounter: Payer: Self-pay | Admitting: Internal Medicine

## 2018-03-20 ENCOUNTER — Other Ambulatory Visit: Payer: Self-pay | Admitting: Internal Medicine

## 2018-03-20 ENCOUNTER — Ambulatory Visit (INDEPENDENT_AMBULATORY_CARE_PROVIDER_SITE_OTHER): Payer: Managed Care, Other (non HMO) | Admitting: Internal Medicine

## 2018-03-20 ENCOUNTER — Ambulatory Visit (INDEPENDENT_AMBULATORY_CARE_PROVIDER_SITE_OTHER): Payer: Managed Care, Other (non HMO)

## 2018-03-20 VITALS — BP 118/76 | HR 81 | Temp 98.1°F | Resp 15 | Ht 64.0 in | Wt 194.0 lb

## 2018-03-20 DIAGNOSIS — R0789 Other chest pain: Secondary | ICD-10-CM

## 2018-03-20 MED ORDER — PANTOPRAZOLE SODIUM 40 MG PO TBEC: 40.0000 mg | DELAYED_RELEASE_TABLET | Freq: Every day | ORAL | 3 refills | Status: DC

## 2018-03-20 MED ORDER — NYSTATIN 100000 UNIT/ML MT SUSP
5.0000 mL | Freq: Four times a day (QID) | OROMUCOSAL | 0 refills | Status: DC
Start: 2018-03-20 — End: 2019-06-21

## 2018-03-20 MED ORDER — DIAZEPAM 5 MG PO TABS: 5.0000 mg | ORAL_TABLET | Freq: Every evening | ORAL | 1 refills | Status: DC | PRN

## 2018-03-20 NOTE — Patient Instructions (Signed)
Nystatin swish and swallow 4 times daily for one week  Pantoprazole once daily in hte am BEFORE breakfast   Valium 5 mg 1/2 hour before bedtime for jaw tightness and insomnia

## 2018-03-20 NOTE — Progress Notes (Signed)
Subjective:  Patient ID: Sheri Guerrero, female    DOB: 10/16/1970  Age: 47 y.o. MRN: [id number]  CC: The primary encounter diagnosis was Chest tightness. A diagnosis of Feeling of chest tightness was also pertinent to this visit.  HPI Sheri Guerrero presents for persistent chest tightness and neck /jaw pain.  Was treated for sinusitis last Wednesday with augmentin and prednisone Taper .  Developed a slight cough on Saturday,  And neck nodes became tender bilaterally..  Bilateral jaw pain, at rest,  Not with exertion.  Not short of breath but chest feels tight.    Mother died of  lung  Cancer,  Non smoker,  Father dying of cancer currently   Outpatient Medications Prior to Visit  Medication Sig Dispense Refill  . amoxicillin-clavulanate (AUGMENTIN) 875-125 MG tablet Take 1 tablet by mouth 2 (two) times daily. 14 tablet 0  . cyanocobalamin (,VITAMIN B-12,) 1000 MCG/ML injection FOR USE WITH WEEKLY B12 INJECTIONS 25 mL 1  . Insulin Syringe-Needle U-100 (B-D INSULIN SYRINGE 1CC/25GX1") 25G X 1" 1 ML MISC USE NEW SYRINGE WITH EACH B12 INJECTION 100 each 1  . Multiple Vitamin (MULTIVITAMIN) tablet Take 1 tablet by mouth daily.      . valACYclovir (VALTREX) 1000 MG tablet TAKE 2 TABLETS BY MOUTH TWICE DAILY AS NEEDED FOR COLD SORES 20 tablet 5  . fluconazole (DIFLUCAN) 150 MG tablet Take 1 tablet (150 mg total) by mouth daily. (Patient not taking: Reported on 03/20/2018) 2 tablet 0  . predniSONE (DELTASONE) 10 MG tablet 6 tablets on Day 1 , then reduce by 1 tablet daily until gone (Patient not taking: Reported on 03/20/2018) 21 tablet 0   No facility-administered medications prior to visit.     Review of Systems;  Patient denies headache, fevers, malaise, unintentional weight loss, skin rash, eye pain, sinus congestion and sinus pain, sore throat, dysphagia,  hemoptysis , cough, dyspnea, wheezing, chest pain, palpitations, orthopnea, edema, abdominal pain, nausea, melena, diarrhea, constipation,  flank pain, dysuria, hematuria, urinary  Frequency, nocturia, numbness, tingling, seizures,  Focal weakness, Loss of consciousness,  Tremor, insomnia, depression, anxiety, and suicidal ideation.      Objective:  BP 118/76 (BP Location: Left Arm, Patient Position: Sitting, Cuff Size: Normal)   Pulse 81   Temp 98.1 F (36.7 C) (Oral)   Resp 15   Ht 5\' 4"  (1.626 m)   Wt 194 lb (88 kg)   SpO2 98%   BMI 33.30 kg/m   BP Readings from Last 3 Encounters:  03/20/18 118/76  03/14/18 110/74  11/14/16 104/68    Wt Readings from Last 3 Encounters:  03/20/18 194 lb (88 kg)  03/14/18 195 lb 6.4 oz (88.6 kg)  11/14/16 182 lb (82.6 kg)    General appearance: alert, cooperative and appears stated age Ears: normal TM's and external ear canals both ears Throat: lips, mucosa, and tongue normal; teeth and gums normal Neck: no adenopathy, no carotid bruit, supple, symmetrical, trachea midline and thyroid not enlarged, symmetric, no tenderness/mass/nodules Back: symmetric, no curvature. ROM normal. No CVA tenderness. Lungs: clear to auscultation bilaterally Heart: regular rate and rhythm, S1, S2 normal, no murmur, click, rub or gallop Abdomen: soft, non-tender; bowel sounds normal; no masses,  no organomegaly Pulses: 2+ and symmetric Skin: Skin color, texture, turgor normal. No rashes or lesions Lymph nodes: Cervical, supraclavicular, and axillary nodes normal.  Lab Results  Component Value Date   HGBA1C 5.7 11/14/2016   HGBA1C 5.5 03/13/2015   HGBA1C 5.4 03/27/2012  Lab Results  Component Value Date   CREATININE 0.66 11/14/2016   CREATININE 0.8 05/06/2014    Lab Results  Component Value Date   WBC 8.9 05/06/2014   HGB 11.9 (L) 05/06/2014   HCT 35.8 (L) 05/06/2014   PLT 255.0 05/06/2014   GLUCOSE 90 11/14/2016   CHOL 191 11/14/2016   TRIG 143.0 11/14/2016   HDL 59.60 11/14/2016   LDLDIRECT 109.0 11/14/2016   LDLCALC 103 (H) 11/14/2016   ALT 14 11/14/2016   AST 14  11/14/2016   NA 137 11/14/2016   K 4.0 11/14/2016   CL 105 11/14/2016   CREATININE 0.66 11/14/2016   BUN 11 11/14/2016   CO2 27 11/14/2016   TSH 1.16 05/06/2014   HGBA1C 5.7 11/14/2016      Assessment & Plan:   Problem List Items Addressed This Visit    Feeling of chest tightness    Occurring in the setting of recent treatment for sinusitis .  Chest x ray and EKG done today were both normal. She is under an enormous amount of emotional stress,  Recommend trial of valium         Other Visit Diagnoses    Chest tightness    -  Primary   Relevant Orders   EKG 12-Lead (Completed)      I have discontinued Annika Orwick's predniSONE. I am also having her start on nystatin, pantoprazole, and diazepam. Additionally, I am having her maintain her multivitamin, valACYclovir, amoxicillin-clavulanate, fluconazole, cyanocobalamin, and Insulin Syringe-Needle U-100.  Meds ordered this encounter  Medications  . nystatin (MYCOSTATIN) 100000 UNIT/ML suspension    Sig: Take 5 mLs (500,000 Units total) by mouth 4 (four) times daily.    Dispense:  150 mL    Refill:  0  . pantoprazole (PROTONIX) 40 MG tablet    Sig: Take 1 tablet (40 mg total) by mouth daily. 30 minutes prior to breakfast    Dispense:  30 tablet    Refill:  3  . diazepam (VALIUM) 5 MG tablet    Sig: Take 1 tablet (5 mg total) by mouth at bedtime as needed for anxiety or muscle spasms (may repeat in one hour if needed).    Dispense:  60 tablet    Refill:  1    Medications Discontinued During This Encounter  Medication Reason  . predniSONE (DELTASONE) 10 MG tablet Completed Course    Follow-up: No follow-ups on file.   Sherlene Shamseresa L Brookley Spitler, MD

## 2018-03-21 DIAGNOSIS — R0789 Other chest pain: Secondary | ICD-10-CM | POA: Insufficient documentation

## 2018-03-21 NOTE — Assessment & Plan Note (Addendum)
Occurring in the setting of recent treatment for sinusitis .  Chest x ray and EKG done today were both normal. She is under an enormous amount of emotional stress,  Recommend trial of valium

## 2018-03-21 NOTE — Telephone Encounter (Signed)
Message sent in error , Olegario MessierKathy handled message on 03/14/18

## 2018-06-12 ENCOUNTER — Other Ambulatory Visit: Payer: Self-pay | Admitting: Internal Medicine

## 2018-08-17 ENCOUNTER — Telehealth: Payer: Self-pay | Admitting: *Deleted

## 2018-08-17 NOTE — Telephone Encounter (Signed)
Copied from CRM [id number]. Topic: Appointment Scheduling - Scheduling Inquiry for Clinic >> Aug 17, 2018  8:23 AM Windy Kalata, NT wrote: Reason for CRM: patient is calling and states she has had diarrhea with no appetite since Tuesday 08/14/18. Informed her the office was full today I could offer another  location or check the schedule for Monday. Patient declined both and would like to be called if it is a cancellation today.

## 2018-08-17 NOTE — Telephone Encounter (Signed)
Patient went to [facility] and was advised she has stomach Bug.

## 2018-09-14 ENCOUNTER — Other Ambulatory Visit: Payer: Self-pay | Admitting: Internal Medicine

## 2018-09-14 MED ORDER — PHENTERMINE HCL 37.5 MG PO TABS: 37.5000 mg | ORAL_TABLET | Freq: Every day | ORAL | 2 refills | Status: DC

## 2018-09-20 ENCOUNTER — Other Ambulatory Visit: Payer: Self-pay | Admitting: Internal Medicine

## 2018-09-20 MED ORDER — PHENTERMINE HCL 37.5 MG PO TABS: 37.5000 mg | ORAL_TABLET | Freq: Every day | ORAL | 2 refills | Status: DC

## 2018-09-20 NOTE — Progress Notes (Signed)
[facility]  Please notify  patient via mychart

## 2018-09-27 ENCOUNTER — Other Ambulatory Visit: Payer: Self-pay | Admitting: Internal Medicine

## 2018-09-27 MED ORDER — VALACYCLOVIR HCL 1 G PO TABS: ORAL_TABLET | ORAL | 3 refills | Status: DC

## 2018-09-27 NOTE — Telephone Encounter (Signed)
Approved per protocol.  

## 2018-09-27 NOTE — Telephone Encounter (Signed)
TC to patient. Obtained correct preferred  pharmacy information. Requested valtrex refill originally sent to wrong pharmacy today due to incorrect location listed in demographics. [facility] [address] [facility] [place] will phone to [facility]. Pharmacist stated medication would be available for pick up today.  Preferred pharmacy updated in Demographics.

## 2018-09-27 NOTE — Telephone Encounter (Signed)
Copied from CRM [id number]. Topic: Quick Communication - See Telephone Encounter >> Sep 27, 2018  8:55 AM Trula Slade wrote: CRM for notification. See Telephone encounter for: 09/27/18. Patient would like a refill on her valACYclovir (VALTREX) 1000 MG tablet medication and have it sent to her preferred pharmacy [facility] of [address]

## 2018-12-18 ENCOUNTER — Telehealth: Payer: Self-pay

## 2018-12-18 NOTE — Telephone Encounter (Signed)
Copied from CRM [id number]. Topic: Quick Communication - Appointment Cancellation >> Dec 17, 2018  4:37 PM Terisa Starr wrote: Patient said she wants to cancel her appt on 5/20. She said she has not even started the phentermine (ADIPEX-P) 37.5 MG tablet. She said she will start the medication after COVID and come in for the follow up    appt was cancelled per pt request and pt stated she will call back once she starts the medication to f/up  Nilam Quakenbush,cma

## 2018-12-26 ENCOUNTER — Other Ambulatory Visit: Payer: Self-pay | Admitting: Obstetrics & Gynecology

## 2018-12-26 ENCOUNTER — Ambulatory Visit: Payer: Managed Care, Other (non HMO) | Admitting: Internal Medicine

## 2018-12-26 DIAGNOSIS — N644 Mastodynia: Secondary | ICD-10-CM

## 2018-12-26 DIAGNOSIS — N6310 Unspecified lump in the right breast, unspecified quadrant: Secondary | ICD-10-CM

## 2018-12-28 ENCOUNTER — Other Ambulatory Visit: Payer: Self-pay | Admitting: Obstetrics & Gynecology

## 2018-12-28 DIAGNOSIS — N6310 Unspecified lump in the right breast, unspecified quadrant: Secondary | ICD-10-CM

## 2018-12-28 DIAGNOSIS — N644 Mastodynia: Secondary | ICD-10-CM

## 2019-01-07 ENCOUNTER — Ambulatory Visit
Admission: RE | Admit: 2019-01-07 | Discharge: 2019-01-07 | Disposition: A | Discharge status: Home / Self Care | Payer: Managed Care, Other (non HMO) | Source: Ambulatory Visit | Attending: Obstetrics & Gynecology | Admitting: Obstetrics & Gynecology

## 2019-01-07 ENCOUNTER — Other Ambulatory Visit: Payer: Self-pay

## 2019-01-07 DIAGNOSIS — N644 Mastodynia: Secondary | ICD-10-CM

## 2019-01-07 DIAGNOSIS — N6310 Unspecified lump in the right breast, unspecified quadrant: Secondary | ICD-10-CM

## 2019-03-14 ENCOUNTER — Telehealth: Payer: Self-pay | Admitting: Internal Medicine

## 2019-03-14 NOTE — Telephone Encounter (Signed)
Relation to pt: self  Call back number: [phone]    Reason for call:  Patient employer [employer] requesting a Dr. Note reflecting patient had COVID test done, patient will have COVID test done on 03/25/2019 at the [facility], please advise  patient when letter has been done.

## 2019-03-20 NOTE — Telephone Encounter (Signed)
If you can put this smart phrase into a letter?  I have been trying for an hour and there is some kind of formatting problem :     To whom it may concern:  The above mentioned patient has physical and/or  psychiatric conditions that are treated in part with regular participation in aerobic exercise and weight training .  I consider regular exercise  to be vital to the maintenance of his/her wellbeing and am recommending that he/she be allowed to return to regular workouts at your facility as long as the appropriate social distancing and other measures needed to minimize transmission or infection with the COVID 19 virus are followed.     Sincerely,    Crecencio Mc, MD

## 2019-03-21 NOTE — Telephone Encounter (Signed)
IT HAS BEEN DELETED FROM CHART .  SORRY ABOUT THAT!

## 2019-03-21 NOTE — Telephone Encounter (Signed)
Letter not meant for this pt.

## 2019-03-25 ENCOUNTER — Other Ambulatory Visit: Payer: Self-pay

## 2019-03-25 DIAGNOSIS — Z20822 Contact with and (suspected) exposure to covid-19: Secondary | ICD-10-CM

## 2019-03-27 LAB — NOVEL CORONAVIRUS, NAA: SARS-CoV-2, NAA: NOT DETECTED

## 2019-05-09 ENCOUNTER — Other Ambulatory Visit: Payer: Self-pay | Admitting: Internal Medicine

## 2019-05-09 NOTE — Telephone Encounter (Signed)
Requested medication (s) are due for refill today: no  Requested medication (s) are on the active medication list: yes  Last refill:  03/14/2018  Future visit scheduled: no  Notes to clinic:  Review for refill   Requested Prescriptions  Pending Prescriptions Disp Refills   fluconazole (DIFLUCAN) 150 MG tablet 2 tablet 0    Sig: Take 1 tablet (150 mg total) by mouth daily.     Off-Protocol Failed - 05/09/2019 12:32 PM      Failed - Medication not assigned to a protocol, review manually.      Failed - Valid encounter within last 12 months    Recent Outpatient Visits          1 year ago Chest tightness   [facility] [place] Crecencio Mc, MD   1 year ago Chronic maxillary sinusitis   [facility] [place] Crecencio Mc, MD   2 years ago Impaired fasting glucose   [place] Crecencio Mc, MD   3 years ago Overweight   [facility] [place] Crecencio Mc, MD   5 years ago Overweight(278.02)   [facility] Crecencio Mc, MD

## 2019-05-09 NOTE — Telephone Encounter (Signed)
Medication Refill - Medication:  fluconazole (DIFLUCAN) 150 MG tablet  Has the patient contacted their pharmacy? Yes advised to call. Pt stated she noticed the symptoms Tuesday   Preferred Pharmacy (with phone number or street name):  [id number] [address], [place] [phone] (Phone) [phone] (Fax)   Agent: Please be advised that RX refills may take up to 3 business days. We ask that you follow-up with your pharmacy.

## 2019-05-17 MED ORDER — FLUCONAZOLE 150 MG PO TABS: 150.0000 mg | ORAL_TABLET | Freq: Every day | ORAL | 0 refills | Status: DC

## 2019-05-27 ENCOUNTER — Other Ambulatory Visit: Payer: Self-pay | Admitting: Internal Medicine

## 2019-05-27 DIAGNOSIS — N3090 Cystitis, unspecified without hematuria: Secondary | ICD-10-CM | POA: Insufficient documentation

## 2019-05-27 MED ORDER — SULFAMETHOXAZOLE-TRIMETHOPRIM 800-160 MG PO TABS: 1.0000 | ORAL_TABLET | Freq: Two times a day (BID) | ORAL | 0 refills | Status: DC

## 2019-05-27 NOTE — Assessment & Plan Note (Signed)
[facility] [place] .

## 2019-06-20 NOTE — Telephone Encounter (Signed)
You may want to see message but I have scheduled patient virtual for 06/21/19.

## 2019-06-21 ENCOUNTER — Other Ambulatory Visit: Payer: Self-pay

## 2019-06-21 ENCOUNTER — Ambulatory Visit (INDEPENDENT_AMBULATORY_CARE_PROVIDER_SITE_OTHER): Payer: Managed Care, Other (non HMO) | Admitting: Internal Medicine

## 2019-06-21 ENCOUNTER — Encounter: Payer: Self-pay | Admitting: Internal Medicine

## 2019-06-21 DIAGNOSIS — R5383 Other fatigue: Secondary | ICD-10-CM

## 2019-06-21 DIAGNOSIS — E538 Deficiency of other specified B group vitamins: Secondary | ICD-10-CM | POA: Diagnosis not present

## 2019-06-21 DIAGNOSIS — E669 Obesity, unspecified: Secondary | ICD-10-CM

## 2019-06-21 DIAGNOSIS — R7301 Impaired fasting glucose: Secondary | ICD-10-CM

## 2019-06-21 MED ORDER — "BD INSULIN SYRINGE 25G X 1"" 1 ML MISC": 1 refills | Status: DC

## 2019-06-21 MED ORDER — PHENTERMINE HCL 37.5 MG PO TABS: 37.5000 mg | ORAL_TABLET | Freq: Every day | ORAL | 0 refills | Status: DC

## 2019-06-21 MED ORDER — CYANOCOBALAMIN 1000 MCG/ML IJ SOLN: INTRAMUSCULAR | 1 refills | Status: DC

## 2019-06-21 MED ORDER — VALACYCLOVIR HCL 1 G PO TABS: ORAL_TABLET | ORAL | 3 refills | Status: DC

## 2019-06-21 NOTE — Assessment & Plan Note (Addendum)
Lab Results  Component Value Date   HGBA1C 5.7 11/14/2016   Her  random glucose is not  elevated but her A1c suggests she is at risk for developing diabetes.  I recommend he follow a low glycemic index diet and particpate regularly in an aerobic  exercise activity.  We should check an A1c in 6 months.

## 2019-06-21 NOTE — Assessment & Plan Note (Signed)
She has had difficulty losing weight due to increased appetite and is requesting a repeat trial of  Phentermine.  She is aware of the possible side effects and risks and understands that    The medication will be discontinued if she has not lost 5% of her body weight over the next 3 months, which , based on today's weight is 10 lbs. 

## 2019-06-21 NOTE — Progress Notes (Signed)
Virtual Visit via [email or web address]  This visit type was conducted due to national recommendations for restrictions regarding the COVID-19 pandemic (e.g. social distancing).  This format is felt to be most appropriate for this patient at this time.  All issues noted in this document were discussed and addressed.  No physical exam was performed (except for noted visual exam findings with Video Visits).   I connected with@ on 06/21/19 at 10:00 AM EST by a video enabled telemedicine application or telephone and verified that I am speaking with the correct person using two identifiers. Location patient: home Location provider: work or home office Persons participating in the virtual visit: patient, provider  I discussed the limitations, risks, security and privacy concerns of performing an evaluation and management service by telephone and the availability of in person appointments. I also discussed with the patient that there may be a patient responsible charge related to this service. The patient expressed understanding and agreed to proceed.   Reason for visit: difficulty losing weight  HPI:  48 yr old female with obesity , IPG (A1c 5.7)  Has been exercising and following a low fat diet for several months but weight has plateaued.   Previous weight loss success using phentermine , requesting 3 months of medication to help her make it through the holidays   ROS: See pertinent positives and negatives per HPI.  Past Medical History:  Diagnosis Date  . Abnormal weight gain   . Anxiety   . Depression     Past Surgical History:  Procedure Laterality Date  . TONSILLECTOMY AND ADENOIDECTOMY  age 59    Family History  Problem Relation Age of Onset  . Cancer Mother        had cervical CA, melanoma, now Stage 4 adeno CA of the lung  . Cancer Paternal Grandmother        breat    SOCIAL HX:  reports that she has never smoked. She has never used smokeless tobacco. She reports current alcohol use. She  reports that she does not use drugs.   Current Outpatient Medications:  .  cyanocobalamin (,VITAMIN B-12,) 1000 MCG/ML injection, FOR USE WITH WEEKLY B12 INJECTIONS, Disp: 25 mL, Rfl: 1 .  Insulin Syringe-Needle U-100 (B-D INSULIN SYRINGE 1CC/25GX1") 25G X 1" 1 ML MISC, USE NEW SYRINGE WITH EACH B12 INJECTION, Disp: 100 each, Rfl: 1 .  MAGNESIUM PO, Take by mouth., Disp: , Rfl:  .  Multiple Vitamin (MULTIVITAMIN) tablet, Take 1 tablet by mouth daily.  , Disp: , Rfl:  .  pantoprazole (PROTONIX) 40 MG tablet, TAKE 1 TABLET BY MOUTH EVERY DAY 30 MINUTES PRIOR TO BREAKFAST, Disp: 90 tablet, Rfl: 1 .  valACYclovir (VALTREX) 1000 MG tablet, TAKE 2 TABLETS BY MOUTH TWICE DAILY AS NEEDED FOR COLD SORES, Disp: 20 tablet, Rfl: 3 .  phentermine (ADIPEX-P) 37.5 MG tablet, Take 1 tablet (37.5 mg total) by mouth daily before breakfast., Disp: 90 tablet, Rfl: 0  EXAM:  VITALS per patient if applicable:  GENERAL: alert, oriented, appears well and in no acute distress  HEENT: atraumatic, conjunttiva clear, no obvious abnormalities on inspection of external nose and ears  NECK: normal movements of the head and neck  LUNGS: on inspection no signs of respiratory distress, breathing rate appears normal, no obvious gross SOB, gasping or wheezing  CV: no obvious cyanosis  MS: moves all visible extremities without noticeable abnormality  PSYCH/NEURO: pleasant and cooperative, no obvious depression or anxiety, speech and thought processing grossly intact  ASSESSMENT  AND PLAN:  Discussed the following assessment and plan:  Other fatigue - Plan: Insulin Syringe-Needle U-100 (B-D INSULIN SYRINGE 1CC/25GX1") 25G X 1" 1 ML MISC  Impaired fasting glucose  Obesity (BMI 30.0-34.9)  B12 deficiency  Impaired fasting glucose Lab Results  Component Value Date   HGBA1C 5.7 11/14/2016   Her  random glucose is not  elevated but her A1c suggests she is at risk for developing diabetes.  I recommend he follow a  low glycemic index diet and particpate regularly in an aerobic  exercise activity.  We should check an A1c in 6 months.    Obesity (BMI 30.0-34.9) She has had difficulty losing weight due to increased appetite and is requesting a repeat  trial of  Phentermine.  She is aware of the possible side effects and risks and understands that    The medication will be discontinued if she has not lost 5% of her body weight over the next 3 months, which , based on today's weight is 10 lbs.   B12 deficiency Diagnosed in 2012 ,  With recurrent headaches occurring unless she takes injections.  continue   Biweekly injections.    Lab Results  Component Value Date   VITAMINB12 376 11/14/2016       I discussed the assessment and treatment plan with the patient. The patient was provided an opportunity to ask questions and all were answered. The patient agreed with the plan and demonstrated an understanding of the instructions.   The patient was advised to call back or seek an in-person evaluation if the symptoms worsen or if the condition fails to improve as anticipated.  A total of 25 minutes of  Non  face to face time was spent with patient more than half of which was spent in counselling about the above mentioned conditions  and coordination of care    Sheri Shams, MD

## 2019-06-21 NOTE — Assessment & Plan Note (Signed)
Diagnosed in 2012 ,  With recurrent headaches occurring unless she takes injections.  continue   Biweekly injections.    Lab Results  Component Value Date   [id number] 657 11/14/2016

## 2019-06-21 NOTE — Progress Notes (Signed)
Patient wants to discuss taking phentermine c/o possibly having blood in her stool but not sure if blood is coming from hemorrhoids.  Pt is concerned since she has a family hx of cancer.  Pt requested refills on syringes and B12.  I sent these refills to the pharmacy.

## 2019-07-25 MED ORDER — PANTOPRAZOLE SODIUM 40 MG PO TBEC: DELAYED_RELEASE_TABLET | ORAL | 1 refills | Status: DC

## 2019-10-17 NOTE — Telephone Encounter (Signed)
Spoke with pt and she stated that she is feeling better today so she is going to hold off on the virtual visit for now. She stated that if it got worse again next week she would give [place] a call back.

## 2020-01-04 ENCOUNTER — Other Ambulatory Visit: Payer: Self-pay | Admitting: Internal Medicine

## 2020-04-22 DIAGNOSIS — Z20822 Contact with and (suspected) exposure to covid-19: Secondary | ICD-10-CM | POA: Insufficient documentation

## 2020-05-01 ENCOUNTER — Encounter: Payer: Self-pay | Admitting: Internal Medicine

## 2020-05-01 ENCOUNTER — Telehealth (INDEPENDENT_AMBULATORY_CARE_PROVIDER_SITE_OTHER): Payer: BC Managed Care – PPO | Admitting: Internal Medicine

## 2020-05-01 ENCOUNTER — Other Ambulatory Visit: Payer: Self-pay

## 2020-05-01 DIAGNOSIS — U071 COVID-19: Secondary | ICD-10-CM

## 2020-05-01 DIAGNOSIS — Z8616 Personal history of COVID-19: Secondary | ICD-10-CM | POA: Insufficient documentation

## 2020-05-01 MED ORDER — HYDROCOD POLST-CPM POLST ER 10-8 MG/5ML PO SUER: 5.0000 mL | Freq: Every evening | ORAL | 0 refills | Status: DC | PRN

## 2020-05-01 NOTE — Progress Notes (Signed)
Virtual Visit via [employer]  This visit type was conducted due to national recommendations for restrictions regarding the COVID-19 pandemic (e.g. social distancing).  This format is felt to be most appropriate for this patient at this time.  All issues noted in this document were discussed and addressed.  No physical exam was performed (except for noted visual exam findings with Video Visits).   I connected with@ on 05/01/20 at  2:30 PM EDT by a video enabled telemedicine application or telephone and verified that I am speaking with the correct person using two identifiers. Location patient: home Location provider: work or home office Persons participating in the virtual visit: patient, provider  I discussed the limitations, risks, security and privacy concerns of performing an evaluation and management service by telephone and the availability of in person appointments. I also discussed with the patient that there may be a patient responsible charge related to this service. The patient expressed understanding and agreed to proceed.   Reason for visit: COVID INFECTION  HPI:   SYMPTOMS ONSET 14TH,  HUSBAND DIAGNOSED 14TH,   PATIENT DIAGNOSED AND   RECEIVED THE MoAb INFUSION ON THE 16 TH AFTER SPIKING A FEVER . HAD 2 DAYS OF FEVER AND MALAISE , FEVERS ENDED ON THE  19TH,  NOW WITH DRY HACKING COUGH WHICH CAUSES CHEST TIGHTNESS.    HAS NOT BEEN COVID VACCINATED   ROS: See pertinent positives and negatives per HPI.  Past Medical History:  Diagnosis Date  . Abnormal weight gain   . Anxiety   . Depression     Past Surgical History:  Procedure Laterality Date  . TONSILLECTOMY AND ADENOIDECTOMY  age 49    Family History  Problem Relation Age of Onset  . Cancer Mother        had cervical CA, melanoma, now Stage 4 adeno CA of the lung  . Cancer Paternal Grandmother        breat    SOCIAL HX:  reports that she has never smoked. She has never used smokeless tobacco. She reports current  alcohol use. She reports that she does not use drugs.   Current Outpatient Medications:  .  cyanocobalamin (,VITAMIN B-12,) 1000 MCG/ML injection, FOR USE WITH WEEKLY B12 INJECTIONS, Disp: 13 mL, Rfl: 3 .  Insulin Syringe-Needle U-100 (B-D INSULIN SYRINGE 1CC/25GX1") 25G X 1" 1 ML MISC, USE NEW SYRINGE WITH EACH B12 INJECTION, Disp: 100 each, Rfl: 1 .  MAGNESIUM PO, Take by mouth., Disp: , Rfl:  .  Multiple Vitamin (MULTIVITAMIN) tablet, Take 1 tablet by mouth daily.  , Disp: , Rfl:  .  chlorpheniramine-HYDROcodone (TUSSIONEX PENNKINETIC ER) 10-8 MG/5ML SUER, Take 5 mLs by mouth at bedtime as needed., Disp: 140 mL, Rfl: 0  EXAM:  VITALS per patient if applicable:  GENERAL: alert, oriented, appears well and in no acute distress  HEENT: atraumatic, conjunttiva clear, no obvious abnormalities on inspection of external nose and ears  NECK: normal movements of the head and neck  LUNGS: on inspection no signs of respiratory distress, breathing rate appears normal, no obvious gross SOB, gasping or wheezing  CV: no obvious cyanosis  MS: moves all visible extremities without noticeable abnormality  PSYCH/NEURO: pleasant and cooperative, no obvious depression or anxiety, speech and thought processing grossly intact  ASSESSMENT AND PLAN:  Discussed the following assessment and plan:  COVID-19 virus infection  COVID-19 virus infection Diagnosed on Sept 16th.  Received MoAb infusion and all symptoms are Improving    Only dry cough remains.  Tussionex     I discussed the assessment and treatment plan with the patient. The patient was provided an opportunity to ask questions and all were answered. The patient agreed with the plan and demonstrated an understanding of the instructions.   The patient was advised to call back or seek an in-person evaluation if the symptoms worsen or if the condition fails to improve as anticipated.  I provided 22 minutes of non-face-to-face time during this  encounter.   [name], MD

## 2020-05-01 NOTE — Assessment & Plan Note (Addendum)
Diagnosed on Sept 16th.  Received MoAb infusion and all symptoms are Improving    Only dry cough remains.  Tussionex

## 2020-05-04 NOTE — Telephone Encounter (Signed)
Pt called to make sure form and message came through on Mychart

## 2020-08-11 ENCOUNTER — Telehealth: Payer: Self-pay

## 2020-08-11 NOTE — Telephone Encounter (Signed)
Pt was advised to go to UC due to no available appts in the office this week.

## 2020-08-11 NOTE — Telephone Encounter (Signed)
Pt called and states that her son came home from college with the flu. She is coughing and has chest congestion. I told her that we do not have any appts avail at this time and recommended the urgent care across the street. She said that she would be fine and hung up. FYI

## 2020-09-23 ENCOUNTER — Other Ambulatory Visit: Payer: Self-pay | Admitting: Internal Medicine

## 2020-09-23 DIAGNOSIS — R5383 Other fatigue: Secondary | ICD-10-CM

## 2020-10-21 ENCOUNTER — Encounter: Payer: Self-pay | Admitting: Internal Medicine

## 2020-10-21 ENCOUNTER — Ambulatory Visit (INDEPENDENT_AMBULATORY_CARE_PROVIDER_SITE_OTHER): Payer: BC Managed Care – PPO

## 2020-10-21 ENCOUNTER — Ambulatory Visit: Payer: BC Managed Care – PPO | Admitting: Internal Medicine

## 2020-10-21 ENCOUNTER — Other Ambulatory Visit: Payer: Self-pay

## 2020-10-21 VITALS — BP 102/68 | HR 76 | Temp 98.3°F | Ht 64.0 in | Wt 192.8 lb

## 2020-10-21 DIAGNOSIS — E538 Deficiency of other specified B group vitamins: Secondary | ICD-10-CM

## 2020-10-21 DIAGNOSIS — E782 Mixed hyperlipidemia: Secondary | ICD-10-CM | POA: Diagnosis not present

## 2020-10-21 DIAGNOSIS — R519 Headache, unspecified: Secondary | ICD-10-CM

## 2020-10-21 DIAGNOSIS — R5383 Other fatigue: Secondary | ICD-10-CM

## 2020-10-21 DIAGNOSIS — Z8701 Personal history of pneumonia (recurrent): Secondary | ICD-10-CM

## 2020-10-21 DIAGNOSIS — Z8616 Personal history of COVID-19: Secondary | ICD-10-CM | POA: Diagnosis not present

## 2020-10-21 DIAGNOSIS — M542 Cervicalgia: Secondary | ICD-10-CM

## 2020-10-21 DIAGNOSIS — E669 Obesity, unspecified: Secondary | ICD-10-CM

## 2020-10-21 DIAGNOSIS — Z1211 Encounter for screening for malignant neoplasm of colon: Secondary | ICD-10-CM

## 2020-10-21 LAB — CBC WITH DIFFERENTIAL/PLATELET
Basophils Absolute: 0.1 10*3/uL (ref 0.0–0.1)
Basophils Relative: 0.9 % (ref 0.0–3.0)
Eosinophils Absolute: 0.2 10*3/uL (ref 0.0–0.7)
Eosinophils Relative: 3.5 % (ref 0.0–5.0)
HCT: 36.6 % (ref 36.0–46.0)
Hemoglobin: 12.3 g/dL (ref 12.0–15.0)
Lymphocytes Relative: 30.6 % (ref 12.0–46.0)
Lymphs Abs: 1.8 10*3/uL (ref 0.7–4.0)
MCHC: 33.7 g/dL (ref 30.0–36.0)
MCV: 89.2 fl (ref 78.0–100.0)
Monocytes Absolute: 0.4 10*3/uL (ref 0.1–1.0)
Monocytes Relative: 7.6 % (ref 3.0–12.0)
Neutro Abs: 3.4 10*3/uL (ref 1.4–7.7)
Neutrophils Relative %: 57.4 % (ref 43.0–77.0)
Platelets: 227 10*3/uL (ref 150.0–400.0)
RBC: 4.11 Mil/uL (ref 3.87–5.11)
RDW: 13.9 % (ref 11.5–15.5)
WBC: 5.9 10*3/uL (ref 4.0–10.5)

## 2020-10-21 LAB — COMPREHENSIVE METABOLIC PANEL
ALT: 18 U/L (ref 0–35)
AST: 19 U/L (ref 0–37)
Albumin: 4 g/dL (ref 3.5–5.2)
Alkaline Phosphatase: 64 U/L (ref 39–117)
BUN: 12 mg/dL (ref 6–23)
CO2: 28 mEq/L (ref 19–32)
Calcium: 9.2 mg/dL (ref 8.4–10.5)
Chloride: 102 mEq/L (ref 96–112)
Creatinine, Ser: 0.62 mg/dL (ref 0.40–1.20)
GFR: 105.16 mL/min (ref >60.00)
Glucose, Bld: 84 mg/dL (ref 70–99)
Potassium: 4.1 mEq/L (ref 3.5–5.1)
Sodium: 136 mEq/L (ref 135–145)
Total Bilirubin: 0.6 mg/dL (ref 0.2–1.2)
Total Protein: 7 g/dL (ref 6.0–8.3)

## 2020-10-21 LAB — VITAMIN B12: Vitamin B-12: 592 pg/mL (ref 211–911)

## 2020-10-21 LAB — LIPID PANEL
Cholesterol: 183 mg/dL (ref 0–200)
HDL: 55.7 mg/dL (ref >39.00)
LDL Cholesterol: 98 mg/dL (ref 0–99)
NonHDL: 127.67
Total CHOL/HDL Ratio: 3
Triglycerides: 147 mg/dL (ref 0.0–149.0)
VLDL: 29.4 mg/dL (ref 0.0–40.0)

## 2020-10-21 LAB — SEDIMENTATION RATE: Sed Rate: 17 mm/hr (ref 0–20)

## 2020-10-21 LAB — C-REACTIVE PROTEIN: CRP: <1 mg/dL (ref 0.5–20.0)

## 2020-10-21 LAB — TSH: TSH: 0.61 u[IU]/mL (ref 0.35–4.50)

## 2020-10-21 NOTE — Progress Notes (Signed)
Subjective:  Patient ID: Sheri Guerrero, female    DOB: [birth year]  Age: 50 y.o. MRN: [id number]  CC: The primary encounter diagnosis was History of pneumonia. Diagnoses of Neck pain of over 3 months duration, Chronic daily headache, History of COVID-19, Fatigue, unspecified type, B12 deficiency, Moderate mixed hyperlipidemia not requiring statin therapy, Colon cancer screening, Obesity (BMI 30.0-34.9), and Personal history of COVID-19 were also pertinent to this visit.  HPI Sheri Guerrero presents for EVALUATION OF FATIGUE  This visit occurred during the SARS-CoV-2 public health emergency.  Safety protocols were in place, including screening questions prior to the visit, additional usage of staff PPE, and extensive cleaning of exam room while observing appropriate contact time as indicated for disinfecting solutions.    Patient has received NO  doses of the available COVID 19 vaccines .   Patient has ben infected twice (see below).   Discussed the risk of mortality at length, given patient's comorbid conditions, and strongly recommended vaccination and immediate contact with office if he develops signs or symptoms or positive diagnosis.    History of Bilateral pneumonia pre COVID   TREATED FOR COVID 1st time )  in September , treated with RegenCOV.   2nd infection in Jan/DEc had Omicron, mild case . Still has a mild lingering cough  Chest cold,  In the morning .  Treated with otc meds,  augmentin and tussionex .  No prednisone (refused)    Cc: FRONTAL HEADACHE (central over the ethmoids) dull,  Occurring Daily for the past month . Wakes up with it.  No  Vision changes ,  No nausea,  But occasional dizziness .  No sinus drainage or congestion . Headache feels better when she places her hand on forehead.  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  8 hours of laptop use daily,  Uses a Makeshift desk at home , has  2 screens at work  [employer] time between home and work   Alcohol use:  1  glass or less during the week.  No history of snoring .  Husband has OSA on CPAP.  He has observed no apneic spells.  No energy,  Some Brainfog , not coughing at night.   Had genetic testing by GYN due to [place], both parents .  No mutations   No colonoscopy yet despite  Having BRBPR every 4 to 6 weeks . Needs colonoscopy  ;[facility]  Exercising without chest pain. Works out  to 5 days per week. Averaging 6 to 8 hours of sleep per week early wakeup at 2 am . (chronic)  Monthly b12 injections,  sometimes weekly .Marland Kitchen  Weight is stable 188 at home fluctates by no more than 5 lbs.  Not losing weight despite exericise and diet .  Started Aptos but headaches became worse.    Outpatient Medications Prior to Visit  Medication Sig Dispense Refill  . B-D INSULIN SYRINGE 1CC/25GX1" 25G X 1" 1 ML MISC USE NEW SYRINGE WITH EACH B12 INJECTION 13 each 4  . cyanocobalamin (,VITAMIN B-12,) 1000 MCG/ML injection FOR USE WITH WEEKLY B12 INJECTIONS 13 mL 3  . Magnesium-Potassium-Pyridox 100-99-200 MG CAPS Take 1 tablet by mouth daily.    . Multiple Vitamin (MULTIVITAMIN) tablet Take 1 tablet by mouth daily.    . chlorpheniramine-HYDROcodone (TUSSIONEX PENNKINETIC ER) 10-8 MG/5ML SUER Take 5 mLs by mouth at bedtime as needed. (Patient not taking: Reported on 10/21/2020) 140 mL 0  . MAGNESIUM PO Take by  mouth. (Patient not taking: Reported on 10/21/2020)     No facility-administered medications prior to visit.    Review of Systems;  Patient denies  fevers, malaise, unintentional weight loss, skin rash, eye pain, sinus congestion and sinus pain, sore throat, dysphagia,  hemoptysis , cough, dyspnea, wheezing, chest pain, palpitations, orthopnea, edema, abdominal pain, nausea, melena, diarrhea, constipation, flank pain, dysuria, hematuria, urinary  Frequency, nocturia, numbness, tingling, seizures,  Focal weakness, Loss of consciousness,  Tremor, insomnia, depression, anxiety, and suicidal ideation.       Objective:  BP 102/68   Pulse 76   Temp 98.3 F (36.8 C)   Ht _0  (1.626 m)   Wt 192 lb 12.8 oz (87.5 kg)   SpO2 99%   BMI 33.09 kg/m   BP Readings from Last 3 Encounters:  10/21/20 102/68  06/21/19 118/72  03/20/18 118/76    Wt Readings from Last 3 Encounters:  10/21/20 192 lb 12.8 oz (87.5 kg)  05/01/20 186 lb (84.4 kg)  06/21/19 193 lb (87.5 kg)    General appearance: alert, cooperative and appears stated age Ears: normal TM's and external ear canals both ears Throat: lips, mucosa, and tongue normal; teeth and gums normal Neck: no adenopathy, no carotid bruit, supple, symmetrical, trachea midline and thyroid not enlarged, symmetric, no tenderness/mass/nodules Back: symmetric, no curvature. ROM normal. No CVA tenderness. Lungs: clear to auscultation bilaterally Heart: regular rate and rhythm, S1, S2 normal, no murmur, click, rub or gallop Abdomen: soft, non-tender; bowel sounds normal; no masses,  no organomegaly Pulses: 2+ and symmetric Skin: Skin color, texture, turgor normal. No rashes or lesions Lymph nodes: Cervical, supraclavicular, and axillary nodes normal.  Neuro:  awake and interactive with normal mood and affect. Higher cortical functions are normal. Speech is clear without word-finding difficulty or dysarthria. Extraocular movements are intact. Visual fields of both eyes are grossly intact. Sensation to light touch is grossly intact bilaterally of upper and lower extremities. Motor examination shows 4+/5 symmetric hand grip and upper extremity and 5/5 lower extremity strength. There is no pronation or drift. Gait is non-ataxic    Lab Results  Component Value Date   HGBA1C 5.7 11/14/2016   HGBA1C 5.5 03/13/2015   HGBA1C 5.4 03/27/2012    Lab Results  Component Value Date   CREATININE 0.62 10/21/2020   CREATININE 0.66 11/14/2016   CREATININE 0.8 05/06/2014    Lab Results  Component Value Date   WBC 5.9 10/21/2020   HGB 12.3 10/21/2020    HCT 36.6 10/21/2020   PLT 227.0 10/21/2020   GLUCOSE 84 10/21/2020   CHOL 183 10/21/2020   TRIG 147.0 10/21/2020   HDL 55.70 10/21/2020   LDLDIRECT 109.0 11/14/2016   LDLCALC 98 10/21/2020   ALT 18 10/21/2020   AST 19 10/21/2020   NA 136 10/21/2020   K 4.1 10/21/2020   CL 102 10/21/2020   CREATININE 0.62 10/21/2020   BUN 12 10/21/2020   CO2 28 10/21/2020   TSH 0.61 10/21/2020   HGBA1C 5.7 11/14/2016     Assessment & Plan:   Problem List Items Addressed This Visit      Unprioritized   B12 deficiency    B12 level is therapeutic on weekly or monthly injections of  1000 mcg.    Lab Results  Component Value Date   [id number] 503 10/21/2020         Relevant Orders   Vitamin B12 (Completed)   Chronic daily headache    Suspect cervicogenic cause given normal  ESR,  Lack of vision changes,  Balance issues.  Plain films of cervical spine note moderately severe degenerative disc disease at C5-6 with disc space narrowing, sclerosis and endplate osteophytes. Facets are aligned. Intact odontoid. No symptoms or signs of sleep apnea.  Recommend PT for neck, if no improvement in 3-4 weeks,  MRI brain   Lab Results  Component Value Date   ESRSEDRATE 17 10/21/2020      Component Value Date/Time   CRP <1.0 10/21/2020 1209        Relevant Orders   DG Cervical Spine Complete (Completed)   Sedimentation rate (Completed)   C-reactive protein (Completed)   RESOLVED: Fatigue   Relevant Orders   TSH (Completed)   CBC with Differential/Platelet (Completed)   Comprehensive metabolic panel (Completed)   Obesity (BMI 30.0-34.9)    I have addressed  BMI and recommended a low glycemic index diet utilizing smaller more frequent meals to increase metabolism.       Personal history of COVID-19    2nd episode (unvaccinated) in [facility] January.  Lingering cough. Chest x ray is clear today        Other Visit Diagnoses    History of pneumonia    -  Primary   Relevant Orders   DG Chest 2  View (Completed)   Neck pain of over 3 months duration       Relevant Orders   DG Cervical Spine Complete (Completed)   History of COVID-19       Relevant Orders   SARS-CoV-2 Semi-Quantitative Total Antibody, Spike   Moderate mixed hyperlipidemia not requiring statin therapy       Relevant Orders   Lipid panel (Completed)   Colon cancer screening       Relevant Orders   Ambulatory referral to Gastroenterology    A total of 40 minutes was spent with patient more than half of which was spent in counseling patient on the above mentioned issues , reviewing and explaining recent labs and imaging studies done, and coordination of care.  I have discontinued Theda Sime's MAGNESIUM PO and chlorpheniramine-HYDROcodone. I am also having her maintain her multivitamin, cyanocobalamin, B-D INSULIN SYRINGE 1CC/25GX1", and Magnesium-Potassium-Pyridox.  No orders of the defined types were placed in this encounter.   Medications Discontinued During This Encounter  Medication Reason  . chlorpheniramine-HYDROcodone (TUSSIONEX PENNKINETIC ER) 10-8 MG/5ML SUER Completed Course  . MAGNESIUM PO Completed Course    Follow-up: No follow-ups on file.   Crecencio Mc, MD

## 2020-10-24 DIAGNOSIS — R519 Headache, unspecified: Secondary | ICD-10-CM | POA: Insufficient documentation

## 2020-10-24 NOTE — Assessment & Plan Note (Signed)
I have addressed  BMI and recommended a low glycemic index diet utilizing smaller more frequent meals to increase metabolism.   

## 2020-10-24 NOTE — Assessment & Plan Note (Addendum)
Suspect cervicogenic cause given normal ESR,  Lack of vision changes,  Balance issues.  Plain films of cervical spine note moderately severe degenerative disc disease at C5-6 with disc space narrowing, sclerosis and endplate osteophytes. Facets are aligned. Intact odontoid. No symptoms or signs of sleep apnea.  Recommend PT for neck, if no improvement in 3-4 weeks,  MRI brain   Lab Results  Component Value Date   ESRSEDRATE 17 10/21/2020      Component Value Date/Time   CRP <1.0 10/21/2020 1209

## 2020-10-24 NOTE — Assessment & Plan Note (Addendum)
B12 level is therapeutic on weekly or monthly injections of  1000 mcg.    Lab Results  Component Value Date   VITAMINB12 592 10/21/2020

## 2020-10-24 NOTE — Assessment & Plan Note (Addendum)
2nd episode (unvaccinated) in [facility] January.  Lingering cough. Chest x ray is clear today

## 2020-10-24 NOTE — Progress Notes (Signed)
1) chest x ray Is clear 2) neck films note moderately severe changes at c5 (a little low to be the explanation for the headaches.  Labs under separate message  Regards,   Duncan Dull, MD

## 2020-10-27 ENCOUNTER — Ambulatory Visit: Payer: BC Managed Care – PPO | Admitting: Internal Medicine

## 2020-10-28 LAB — SARS-COV-2 SEMI-QUANTITATIVE TOTAL ANTIBODY, SPIKE: SARS COV2 AB, Total Spike Semi QN: >2500 U/mL — ABNORMAL HIGH (ref <0.8)

## 2020-11-04 DIAGNOSIS — N951 Menopausal and female climacteric states: Secondary | ICD-10-CM

## 2020-11-23 MED ORDER — VALACYCLOVIR HCL 1 G PO TABS: ORAL_TABLET | ORAL | 3 refills | Status: DC

## 2020-11-23 NOTE — Addendum Note (Signed)
Addended by: Sandy Salaam on: 11/23/2020 09:30 AM   Modules accepted: Orders

## 2020-11-25 ENCOUNTER — Other Ambulatory Visit (INDEPENDENT_AMBULATORY_CARE_PROVIDER_SITE_OTHER): Payer: BC Managed Care – PPO

## 2020-11-25 ENCOUNTER — Other Ambulatory Visit: Payer: Self-pay

## 2020-11-25 DIAGNOSIS — N951 Menopausal and female climacteric states: Secondary | ICD-10-CM

## 2020-11-26 ENCOUNTER — Other Ambulatory Visit: Payer: Self-pay

## 2020-11-26 ENCOUNTER — Telehealth (INDEPENDENT_AMBULATORY_CARE_PROVIDER_SITE_OTHER): Payer: Self-pay | Admitting: Gastroenterology

## 2020-11-26 DIAGNOSIS — Z1211 Encounter for screening for malignant neoplasm of colon: Secondary | ICD-10-CM

## 2020-11-26 DIAGNOSIS — R011 Cardiac murmur, unspecified: Secondary | ICD-10-CM | POA: Insufficient documentation

## 2020-11-26 DIAGNOSIS — K589 Irritable bowel syndrome without diarrhea: Secondary | ICD-10-CM | POA: Insufficient documentation

## 2020-11-26 LAB — FOLLICLE STIMULATING HORMONE: FSH: 34.3 m[IU]/mL

## 2020-11-26 LAB — LUTEINIZING HORMONE: LH: 31.98 m[IU]/mL

## 2020-11-26 MED ORDER — PEG 3350-KCL-NA BICARB-NACL 420 G PO SOLR: 4000.0000 mL | Freq: Once | ORAL | 0 refills | Status: AC

## 2020-11-26 NOTE — Progress Notes (Signed)
Gastroenterology Pre-Procedure Review  Request Date: Thursday 01/28/21 Requesting Physician: Dr. Allegra Lai  PATIENT REVIEW QUESTIONS: The patient responded to the following health history questions as indicated:    1. Are you having any GI issues? no 2. Do you have a personal history of Polyps? Dad polyps 3. Do you have a family history of Colon Cancer or Polyps? yes (2 maternal uncles colon cancer) 4. Diabetes Mellitus? no 5. Joint replacements in the past 12 months?no 6. Major health problems in the past 3 months?no 7. Any artificial heart valves, MVP, or defibrillator?no    MEDICATIONS & ALLERGIES:    Patient reports the following regarding taking any anticoagulation/antiplatelet therapy:   Plavix, Coumadin, Eliquis, Xarelto, Lovenox, Pradaxa, Brilinta, or Effient? no Aspirin? no  Patient confirms/reports the following medications:  Current Outpatient Medications  Medication Sig Dispense Refill  . B-D INSULIN SYRINGE 1CC/25GX1" 25G X 1" 1 ML MISC USE NEW SYRINGE WITH EACH B12 INJECTION 13 each 4  . cyanocobalamin (,VITAMIN B-12,) 1000 MCG/ML injection FOR USE WITH WEEKLY B12 INJECTIONS 13 mL 3  . Magnesium-Potassium-Pyridox 100-99-200 MG CAPS Take 1 tablet by mouth daily.    . Multiple Vitamin (MULTIVITAMIN) tablet Take 1 tablet by mouth daily.    . valACYclovir (VALTREX) 1000 MG tablet TAKE 2 TABLETS BY MOUTH TWICE DAILY AS NEEDED FOR COLD SORES 20 tablet 3   No current facility-administered medications for this visit.    Patient confirms/reports the following allergies:  No Known Allergies  No orders of the defined types were placed in this encounter.   AUTHORIZATION INFORMATION Primary Insurance: 1D#: Group #:  Secondary Insurance: 1D#: Group #:  SCHEDULE INFORMATION: Date: 01/28/21 Time: Location:[facility]

## 2020-11-27 ENCOUNTER — Telehealth: Payer: BC Managed Care – PPO

## 2020-12-28 ENCOUNTER — Telehealth (INDEPENDENT_AMBULATORY_CARE_PROVIDER_SITE_OTHER): Payer: BC Managed Care – PPO | Admitting: Internal Medicine

## 2020-12-28 ENCOUNTER — Telehealth: Payer: BC Managed Care – PPO | Admitting: Internal Medicine

## 2020-12-28 ENCOUNTER — Telehealth: Payer: Self-pay | Admitting: Gastroenterology

## 2020-12-28 ENCOUNTER — Other Ambulatory Visit: Payer: Self-pay

## 2020-12-28 ENCOUNTER — Encounter: Payer: Self-pay | Admitting: Internal Medicine

## 2020-12-28 VITALS — Ht 64.0 in | Wt 191.0 lb

## 2020-12-28 DIAGNOSIS — E669 Obesity, unspecified: Secondary | ICD-10-CM

## 2020-12-28 DIAGNOSIS — R7301 Impaired fasting glucose: Secondary | ICD-10-CM | POA: Diagnosis not present

## 2020-12-28 DIAGNOSIS — Z1211 Encounter for screening for malignant neoplasm of colon: Secondary | ICD-10-CM

## 2020-12-28 MED ORDER — PHENTERMINE HCL 37.5 MG PO TABS: 37.5000 mg | ORAL_TABLET | Freq: Every day | ORAL | 2 refills | Status: DC

## 2020-12-28 NOTE — Telephone Encounter (Signed)
Patient called to reschedule Colonoscopy on January 28, 2021. Please call to reschedule

## 2020-12-28 NOTE — Progress Notes (Signed)
Virtual Visit via Caregility  This visit type was conducted due to national recommendations for restrictions regarding the COVID-19 pandemic (e.g. social distancing).  This format is felt to be most appropriate for this patient at this time.  All issues noted in this document were discussed and addressed.  No physical exam was performed (except for noted visual exam findings with Video Visits).   I connected with@ on 12/28/20 at  9:30 AM EDT by a video enabled telemedicine application  and verified that I am speaking with the correct person using two identifiers. Location patient: home Location provider: work or home office Persons participating in the virtual visit: patient, provider  I discussed the limitations, risks, security and privacy concerns of performing an evaluation and management service by telephone and the availability of in person appointments. I also discussed with the patient that there may be a patient responsible charge related to this service. The patient expressed understanding and agreed to proceed.  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  Exercising 5 times per week,  Diet is healthy but appetite is large.  Prior trials of phentermine have been inconsistent;  She recalls that many years ago when she was fully committed,  She used phentermine which  resulted in a sustained  weight loss of 60 lbs.  More recent trials have not been successful but were complicated by lack of commitment.  She is requesting a 3 month trial of phentermine before using the alternative recommended therapies which are cost prohibitive.    Diet and exercise plan reviewed in detail.   She has been alcohol abstinent as part of her diet plan.  Alternative medications (Wegovy/ozempic) discussed including their MOA and contraindications.  Recent labs reviewed with patient .  Lab Results  Component  Value Date   HGBA1C 5.7 11/14/2016      ROS: See pertinent positives and negatives per HPI.  Past Medical History:  Diagnosis Date  . Abnormal weight gain   . Anxiety   . Depression     Past Surgical History:  Procedure Laterality Date  . TONSILLECTOMY AND ADENOIDECTOMY  age 31    Family History  Problem Relation Age of Onset  . Cancer Mother        had cervical CA, melanoma, then Stage 4 adeno CA of the lung  . Cancer Paternal Grandmother        breat  . Liver cancer Father     SOCIAL HX:  reports that she has never smoked. She has never used smokeless tobacco. She reports no current alcohol use. She reports that she does not use drugs.  Current Outpatient Medications:  .  B-D INSULIN SYRINGE 1CC/25GX1" 25G X 1" 1 ML MISC, USE NEW SYRINGE WITH EACH B12 INJECTION, Disp: 13 each, Rfl: 4 .  cyanocobalamin (,VITAMIN B-12,) 1000 MCG/ML injection, FOR USE WITH WEEKLY B12 INJECTIONS, Disp: 13 mL, Rfl: 3 .  Magnesium-Potassium-Pyridox 100-99-200 MG CAPS, Take 1 tablet by mouth daily., Disp: , Rfl:  .  Multiple Vitamin (MULTIVITAMIN) tablet, Take 1 tablet by mouth daily., Disp: , Rfl:  .  phentermine (ADIPEX-P) 37.5 MG tablet, Take 1 tablet (37.5 mg total) by mouth daily before breakfast., Disp: 30 tablet, Rfl: 2 .  valACYclovir (VALTREX) 1000 MG tablet, TAKE 2 TABLETS BY MOUTH TWICE DAILY AS NEEDED FOR COLD SORES, Disp: 20 tablet, Rfl: 3 .  polyethylene glycol-electrolytes (NULYTELY) 420  g solution, PLEASE SEE ATTACHED FOR DETAILED DIRECTIONS (Patient not taking: Reported on 12/28/2020), Disp: , Rfl:   EXAM:  VITALS per patient if applicable:  GENERAL: alert, oriented, appears well and in no acute distress  HEENT: atraumatic, conjunttiva clear, no obvious abnormalities on inspection of external nose and ears  NECK: normal movements of the head and neck  LUNGS: on inspection no signs of respiratory distress, breathing rate appears normal, no obvious gross SOB, gasping or  wheezing  CV: no obvious cyanosis  MS: moves all visible extremities without noticeable abnormality  PSYCH/NEURO: pleasant and cooperative, no obvious depression or anxiety, speech and thought processing grossly intact  ASSESSMENT AND PLAN:  Discussed the following assessment and plan:  Obesity (BMI 30.0-34.9)  Obesity (BMI 30.0-34.9) Her starting weight is 192 lbs based on home reading this morning.  I have authorized the use of phentermine for 1 month with plans to refill for 2 additional months provided she has her  vital signs checked a week after starting, and returns for follow up.  Plan to transition after 3 months to [facility] if the cost can be reduced. She is at risk for diabetes and will return for a1c .  Goal weight loss for 3 months is 10 lbs.   Lab Results  Component Value Date   HGBA1C 5.7 11/14/2016   .last    I discussed the assessment and treatment plan with the patient. The patient was provided an opportunity to ask questions and all were answered. The patient agreed with the plan and demonstrated an understanding of the instructions.   The patient was advised to call back or seek an in-person evaluation if the symptoms worsen or if the condition fails to improve as anticipated.   I spent 20 minutes dedicated to the care of this patient on the date of this encounter to include pre-visit review of her medical history,  counselling patient on medications and diet, and post visit ordering of testing and therapeutics.    [name], MD

## 2020-12-28 NOTE — Telephone Encounter (Signed)
Called patient and got patient moved to 04/01/21 sent new instructions to mychart. Patient states she has the prep

## 2020-12-29 ENCOUNTER — Telehealth: Payer: Self-pay

## 2020-12-29 NOTE — Assessment & Plan Note (Addendum)
Her starting weight is 192 lbs based on home reading this morning.  I have authorized the use of phentermine for 1 month with plans to refill for 2 additional months provided she has her  vital signs checked a week after starting, and returns for follow up.  Plan to transition after 3 months to [facility] if the cost can be reduced. She is at risk for diabetes and will return for a1c .  Goal weight loss for 3 months is 10 lbs.   Lab Results  Component Value Date   HGBA1C 5.7 11/14/2016   .last

## 2020-12-29 NOTE — Telephone Encounter (Signed)
PA for Phentermine has been approved through 03/30/2021. Pt is aware.

## 2021-03-11 ENCOUNTER — Other Ambulatory Visit: Payer: Self-pay | Admitting: Internal Medicine

## 2021-03-22 ENCOUNTER — Telehealth: Payer: Self-pay | Admitting: Internal Medicine

## 2021-03-22 ENCOUNTER — Encounter: Payer: Self-pay | Admitting: Gastroenterology

## 2021-03-22 NOTE — Telephone Encounter (Signed)
Spoke with pt and she stated that she is going to the [employer]. Appt already made.

## 2021-03-22 NOTE — Telephone Encounter (Signed)
noted 

## 2021-03-22 NOTE — Telephone Encounter (Signed)
Patient informed, Due to the high volume of calls and your symptoms we have to forward your call to our Triage Nurse to expedient your call. Please hold for the transfer.  Patient transferred to [facility] at [employer] Nurse. Due to having an ear ache. No openings in office or virtual.

## 2021-03-31 ENCOUNTER — Ambulatory Visit: Payer: BC Managed Care – PPO | Admitting: Internal Medicine

## 2021-03-31 LAB — HM PAP SMEAR: HM Pap smear: NORMAL

## 2021-04-01 ENCOUNTER — Encounter: Payer: Self-pay | Admitting: Gastroenterology

## 2021-04-01 ENCOUNTER — Ambulatory Visit: Payer: BC Managed Care – PPO | Admitting: Anesthesiology

## 2021-04-01 ENCOUNTER — Ambulatory Visit
Admission: RE | Admit: 2021-04-01 | Discharge: 2021-04-01 | Disposition: A | Discharge status: Home / Self Care | Payer: BC Managed Care – PPO | Attending: Gastroenterology | Admitting: Gastroenterology

## 2021-04-01 ENCOUNTER — Other Ambulatory Visit: Payer: Self-pay

## 2021-04-01 ENCOUNTER — Encounter
Admission: RE | Disposition: A | Discharge status: Home / Self Care | Payer: Self-pay | Source: Home / Self Care | Attending: Gastroenterology

## 2021-04-01 DIAGNOSIS — Z1211 Encounter for screening for malignant neoplasm of colon: Secondary | ICD-10-CM | POA: Diagnosis not present

## 2021-04-01 DIAGNOSIS — Z800 Family history of malignant neoplasm of digestive organs: Secondary | ICD-10-CM | POA: Diagnosis not present

## 2021-04-01 HISTORY — PX: COLONOSCOPY WITH PROPOFOL: SHX5780

## 2021-04-01 HISTORY — DX: Anemia, unspecified: D64.9

## 2021-04-01 SURGERY — COLONOSCOPY WITH PROPOFOL
Anesthesia: General

## 2021-04-01 MED ORDER — STERILE WATER FOR IRRIGATION IR SOLN: Status: DC | PRN

## 2021-04-01 MED ORDER — LACTATED RINGERS IV SOLN: INTRAVENOUS | Status: DC

## 2021-04-01 MED ORDER — ONDANSETRON HCL 4 MG/2ML IJ SOLN: 4.0000 mg | Freq: Once | INTRAMUSCULAR | Status: DC | PRN

## 2021-04-01 MED ORDER — PROPOFOL 10 MG/ML IV BOLUS
INTRAVENOUS | Status: DC | PRN
  Administered 2021-04-01 (×3): 40 mg via INTRAVENOUS
  Administered 2021-04-01: 50 mg via INTRAVENOUS
  Administered 2021-04-01: 40 mg via INTRAVENOUS
  Administered 2021-04-01: 50 mg via INTRAVENOUS
  Administered 2021-04-01: 150 mg via INTRAVENOUS

## 2021-04-01 MED ORDER — ACETAMINOPHEN 325 MG PO TABS: 325.0000 mg | ORAL_TABLET | ORAL | Status: DC | PRN

## 2021-04-01 MED ORDER — SODIUM CHLORIDE 0.9 % IV SOLN: INTRAVENOUS | Status: DC

## 2021-04-01 MED ORDER — ACETAMINOPHEN 160 MG/5ML PO SOLN: 325.0000 mg | ORAL | Status: DC | PRN

## 2021-04-01 MED ORDER — LIDOCAINE HCL (CARDIAC) PF 100 MG/5ML IV SOSY
PREFILLED_SYRINGE | INTRAVENOUS | Status: DC | PRN
  Administered 2021-04-01: 30 mg via INTRAVENOUS

## 2021-04-01 SURGICAL SUPPLY — 26 items
CLIP HMST 235XBRD CATH ROT (MISCELLANEOUS) IMPLANT
CLIP RESOLUTION 360 11X235 (MISCELLANEOUS)
ELECT REM PT RETURN 9FT ADLT (ELECTROSURGICAL)
ELECTRODE REM PT RTRN 9FT ADLT (ELECTROSURGICAL) IMPLANT
FCP ESCP3.2XJMB 240X2.8X (MISCELLANEOUS)
FORCEPS BIOP RAD 4 LRG CAP 4 (CUTTING FORCEPS) IMPLANT
FORCEPS BIOP RJ4 240 W/NDL (MISCELLANEOUS)
FORCEPS ESCP3.2XJMB 240X2.8X (MISCELLANEOUS) IMPLANT
GOWN CVR UNV OPN BCK APRN NK (MISCELLANEOUS) ×2 IMPLANT
GOWN ISOL THUMB LOOP REG UNIV (MISCELLANEOUS) ×4
INJECTOR VARIJECT VIN23 (MISCELLANEOUS) IMPLANT
KIT DEFENDO VALVE AND CONN (KITS) IMPLANT
KIT PRC NS LF DISP ENDO (KITS) ×1 IMPLANT
KIT PROCEDURE OLYMPUS (KITS) ×2
MANIFOLD NEPTUNE II (INSTRUMENTS) ×2 IMPLANT
MARKER SPOT ENDO TATTOO 5ML (MISCELLANEOUS) IMPLANT
PROBE APC STR FIRE (PROBE) IMPLANT
RETRIEVER NET ROTH 2.5X230 LF (MISCELLANEOUS) IMPLANT
SNARE COLD EXACTO (MISCELLANEOUS) IMPLANT
SNARE SHORT THROW 13M SML OVAL (MISCELLANEOUS) IMPLANT
SNARE SHORT THROW 30M LRG OVAL (MISCELLANEOUS) IMPLANT
SNARE SNG USE RND 15MM (INSTRUMENTS) IMPLANT
SPOT EX ENDOSCOPIC TATTOO (MISCELLANEOUS)
TRAP ETRAP POLY (MISCELLANEOUS) IMPLANT
VARIJECT INJECTOR VIN23 (MISCELLANEOUS)
WATER STERILE IRR 250ML POUR (IV SOLUTION) ×2 IMPLANT

## 2021-04-01 NOTE — Anesthesia Procedure Notes (Signed)
Date/Time: 04/01/2021 7:53 AM Performed by: Maree Krabbe, CRNA Pre-anesthesia Checklist: Patient identified, Emergency Drugs available, Suction available, Timeout performed and Patient being monitored Patient Re-evaluated:Patient Re-evaluated prior to induction Oxygen Delivery Method: Nasal cannula Placement Confirmation: positive ETCO2

## 2021-04-01 NOTE — Transfer of Care (Signed)
Immediate Anesthesia Transfer of Care Note  Patient: Sheri Guerrero  Procedure(s) Performed: COLONOSCOPY WITH PROPOFOL  Patient Location: PACU  Anesthesia Type: General  Level of Consciousness: awake, alert  and patient cooperative  Airway and Oxygen Therapy: Patient Spontanous Breathing and Patient connected to supplemental oxygen  Post-op Assessment: Post-op Vital signs reviewed, Patient's Cardiovascular Status Stable, Respiratory Function Stable, Patent Airway and No signs of Nausea or vomiting  Post-op Vital Signs: Reviewed and stable  Complications: No notable events documented.

## 2021-04-01 NOTE — H&P (Signed)
Arlyss Repress, MD [address]  [address]  [place], [place] [address]  Main: [phone]  Fax: [phone] Pager: [phone]  Primary Care Physician:  [name], MD Primary Gastroenterologist:  Dr. Arlyss Repress  Pre-Procedure History & Physical: HPI:  Sheri Guerrero is a 50 y.o. female is here for an colonoscopy.   Past Medical History:  Diagnosis Date   Abnormal weight gain    Anemia    Anxiety    Depression     Past Surgical History:  Procedure Laterality Date   ABLATION     uterine; approx 2017-2018   KNEE SURGERY Right    age 25   TONSILLECTOMY AND ADENOIDECTOMY  age 28    Prior to Admission medications   Medication Sig Start Date End Date Taking? Authorizing Provider  cyanocobalamin (,VITAMIN B-12,) 1000 MCG/ML injection FOR USE WITH WEEKLY B12 INJECTIONS 03/11/21  Yes [name], MD  Magnesium-Potassium-Pyridox 100-99-200 MG CAPS Take 1 tablet by mouth daily.   Yes [provider]  Multiple Vitamin (MULTIVITAMIN) tablet Take 1 tablet by mouth daily.   Yes [provider]  valACYclovir (VALTREX) 1000 MG tablet TAKE 2 TABLETS BY MOUTH TWICE DAILY AS NEEDED FOR COLD SORES 11/23/20  Yes [name], MD  B-D INSULIN SYRINGE 1CC/25GX1" 25G X 1" 1 ML MISC USE NEW SYRINGE WITH EACH B12 INJECTION 09/23/20   [name], MD  phentermine (ADIPEX-P) 37.5 MG tablet Take 1 tablet (37.5 mg total) by mouth daily before breakfast. Patient not taking: Reported on 03/22/2021 12/28/20   [name], MD  polyethylene glycol-electrolytes (NULYTELY) 420 g solution PLEASE SEE ATTACHED FOR DETAILED DIRECTIONS Patient not taking: Reported on 12/28/2020 11/26/20   [provider]    Allergies as of 12/28/2020   (No Known Allergies)    Family History  Problem Relation Age of Onset   Cancer Mother        had cervical CA, melanoma, then Stage 4 adeno CA of the lung   Cancer Paternal Grandmother        breat   Liver cancer Father      Social History   Socioeconomic History   Marital status: Married    Spouse name: Not on file   Number of children: Not on file   Years of education: Not on file   Highest education level: Not on file  Occupational History   Occupation: PT at [employer]    Comment: FT caregiver for parents  Tobacco Use   Smoking status: Never   Smokeless tobacco: Never  Substance and Sexual Activity   Alcohol use: Yes    Comment: occassional, one a week   Drug use: No   Sexual activity: Not on file  Other Topics Concern   Not on file  Social History Narrative   Lives with spouse   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 Neck:  Supple; no masses or thyromegaly. Lungs:  Clear throughout to auscultation.    Heart:  Regular rate and rhythm. Abdomen:  Soft, nontender and nondistended. Normal bowel sounds, without guarding, and without rebound.   Neurologic:  Alert and  oriented x4;  grossly normal neurologically.  Impression/Plan: Sheri Guerrero is here for an colonoscopy to be performed for colon cancer screening  Risks, benefits, limitations, and alternatives regarding  colonoscopy have been reviewed with the patient.  Questions have been answered.  All parties agreeable.   Lannette Donath, MD  04/01/2021, 7:29 AM

## 2021-04-01 NOTE — Op Note (Signed)
[facility] Gastroenterology Patient Name: Sheri Guerrero Procedure Date: 04/01/2021 7:18 AM MRN: [id number] Account #: [id number] Date of Birth: Apr 09, 1971 Admit Type: Outpatient Age: 50 Room: [facility] OR ROOM 01 Gender: Female Note Status: Finalized Procedure:             Colonoscopy Indications:           Colon cancer screening in patient at increased risk:                         Family history of colorectal cancer in multiple 2nd                         degree relatives, This is the patient's first                         colonoscopy Providers:             Toney Reil MD, MD Referring MD:          Duncan Dull, MD (Referring MD) Medicines:             General Anesthesia Complications:         No immediate complications. Estimated blood loss: None. Procedure:             Pre-Anesthesia Assessment:                        - Prior to the procedure, a History and Physical was                         performed, and patient medications and allergies were                         reviewed. The patient is competent. The risks and                         benefits of the procedure and the sedation options and                         risks were discussed with the patient. All questions                         were answered and informed consent was obtained.                         Patient identification and proposed procedure were                         verified by the physician, the nurse, the                         anesthesiologist, the anesthetist and the technician                         in the pre-procedure area in the procedure room in the                         endoscopy suite. Mental Status Examination: alert and  oriented. Airway Examination: normal oropharyngeal                         airway and neck mobility. Respiratory Examination:                         clear to auscultation. CV Examination: normal.                          Prophylactic Antibiotics: The patient does not require                         prophylactic antibiotics. Prior Anticoagulants: The                         patient has taken no previous anticoagulant or                         antiplatelet agents. ASA Grade Assessment: II - A                         patient with mild systemic disease. After reviewing                         the risks and benefits, the patient was deemed in                         satisfactory condition to undergo the procedure. The                         anesthesia plan was to use general anesthesia.                         Immediately prior to administration of medications,                         the patient was re-assessed for adequacy to receive                         sedatives. The heart rate, respiratory rate, oxygen                         saturations, blood pressure, adequacy of pulmonary                         ventilation, and response to care were monitored                         throughout the procedure. The physical status of the                         patient was re-assessed after the procedure.                        After obtaining informed consent, the colonoscope was                         passed under direct vision. Throughout the procedure,  the patient's blood pressure, pulse, and oxygen                         saturations were monitored continuously. The                         Colonoscope was introduced through the anus and                         advanced to the the cecum, identified by appendiceal                         orifice and ileocecal valve. The colonoscopy was                         performed without difficulty. The patient tolerated                         the procedure well. The quality of the bowel                         preparation was evaluated using the BBPS [facility] Bowel                         Preparation Scale) with scores of: Right Colon = 3,                          Transverse Colon = 3 and Left Colon = 3 (entire mucosa                         seen well with no residual staining, small fragments                         of stool or opaque liquid). The total BBPS score                         equals 9. Findings:      The perianal and digital rectal examinations were normal. Pertinent       negatives include normal sphincter tone and no palpable rectal lesions.      The retroflexed view of the distal rectum and anal verge was normal and       showed no anal or rectal abnormalities.      The entire examined colon appeared normal. Impression:            - The distal rectum and anal verge are normal on                         retroflexion view.                        - The entire examined colon is normal.                        - No specimens collected. Recommendation:        - Discharge patient to home (with escort).                        -  Resume previous diet today.                        - Continue present medications.                        - Repeat colonoscopy in 5 years for screening purposes. Procedure Code(s):     --- Professional ---                        M2263, Colorectal cancer screening; colonoscopy on                         individual not meeting criteria for high risk Diagnosis Code(s):     --- Professional ---                        Z80.0, Family history of malignant neoplasm of                         digestive organs CPT copyright 2019 American Medical Association. All rights reserved. The codes documented in this report are preliminary and upon coder review may  be revised to meet current compliance requirements. Dr. Libby Maw Toney Reil MD, MD 04/01/2021 7:55:08 AM This report has been signed electronically. Number of Addenda: 0 Note Initiated On: 04/01/2021 7:18 AM Scope Withdrawal Time: 0 hours 7 minutes 12 seconds  Total Procedure Duration: 0 hours 10 minutes 59 seconds  Estimated Blood Loss:   Estimated blood loss: none.      [facility]

## 2021-04-01 NOTE — Anesthesia Preprocedure Evaluation (Signed)
Anesthesia Evaluation  Patient identified by MRN, date of birth, ID band Patient awake    Reviewed: Allergy & Precautions, NPO status   Airway Mallampati: II  TM Distance: >3 FB     Dental   Pulmonary    Pulmonary exam normal        Cardiovascular negative cardio ROS   Rhythm:Regular Rate:Normal     Neuro/Psych PSYCHIATRIC DISORDERS Anxiety Depression    GI/Hepatic   Endo/Other  BMI 32  Renal/GU      Musculoskeletal   Abdominal   Peds  Hematology   Anesthesia Other Findings   Reproductive/Obstetrics                             Anesthesia Physical Anesthesia Plan  ASA: 2  Anesthesia Plan: General   Post-op Pain Management:    Induction: Intravenous  PONV Risk Score and Plan: Propofol infusion, TIVA and Treatment may vary due to age or medical condition  Airway Management Planned: Natural Airway and Nasal Cannula  Additional Equipment:   Intra-op Plan:   Post-operative Plan:   Informed Consent: I have reviewed the patients History and Physical, chart, labs and discussed the procedure including the risks, benefits and alternatives for the proposed anesthesia with the patient or authorized representative who has indicated his/her understanding and acceptance.       Plan Discussed with: CRNA  Anesthesia Plan Comments:         Anesthesia Quick Evaluation

## 2021-04-01 NOTE — Anesthesia Postprocedure Evaluation (Signed)
Anesthesia Post Note  Patient: Sheri Guerrero  Procedure(s) Performed: COLONOSCOPY WITH PROPOFOL     Patient location during evaluation: PACU Anesthesia Type: General Level of consciousness: awake Pain management: pain level controlled Vital Signs Assessment: post-procedure vital signs reviewed and stable Respiratory status: respiratory function stable Cardiovascular status: stable Postop Assessment: no signs of nausea or vomiting Anesthetic complications: no   No notable events documented.  Jola Babinski

## 2021-04-02 ENCOUNTER — Encounter: Payer: Self-pay | Admitting: Gastroenterology

## 2021-04-14 ENCOUNTER — Telehealth: Payer: Self-pay | Admitting: Internal Medicine

## 2021-04-14 NOTE — Telephone Encounter (Signed)
Patient informed, Due to the high volume of calls and your symptoms we have to forward your call to our Triage Nurse to expedient your call. Please hold for the transfer.  Patient transferred to Access Nurse. Due to testing positive for COVID today,with a temperature of 103 degrees,a cough,congestion and body aches.No openings in office or virtual.

## 2021-04-14 NOTE — Telephone Encounter (Signed)
Patient was given home care advise and if sx worsen they are to return call back per access nurse.

## 2021-07-30 ENCOUNTER — Encounter: Payer: Self-pay | Admitting: Internal Medicine

## 2021-09-01 ENCOUNTER — Telehealth (INDEPENDENT_AMBULATORY_CARE_PROVIDER_SITE_OTHER): Payer: BC Managed Care – PPO | Admitting: Internal Medicine

## 2021-09-01 ENCOUNTER — Encounter: Payer: Self-pay | Admitting: Internal Medicine

## 2021-09-01 VITALS — Ht 64.0 in

## 2021-09-01 DIAGNOSIS — E669 Obesity, unspecified: Secondary | ICD-10-CM | POA: Diagnosis not present

## 2021-09-01 DIAGNOSIS — E538 Deficiency of other specified B group vitamins: Secondary | ICD-10-CM

## 2021-09-01 DIAGNOSIS — R7301 Impaired fasting glucose: Secondary | ICD-10-CM | POA: Diagnosis not present

## 2021-09-01 DIAGNOSIS — E66811 Obesity, class 1: Secondary | ICD-10-CM

## 2021-09-01 NOTE — Progress Notes (Signed)
Virtual Visit via Caregility Note  This visit type was conducted due to national recommendations for restrictions regarding the COVID-19 pandemic (e.g. social distancing).  This format is felt to be most appropriate for this patient at this time.  All issues noted in this document were discussed and addressed.  No physical exam was performed (except for noted visual exam findings with Video Visits).   I connected withNAME@ on 09/01/21 at 11:30 AM EST by a video enabled telemedicine applicationand verified that I am speaking with the correct person using two identifiers. Location patient: parked car Location provider: work or home office Persons participating in the virtual visit: patient, provider  I discussed the limitations, risks, security and privacy concerns of performing an evaluation and management service by telephone and the availability of in person appointments. I also discussed with the patient that there may be a patient responsible charge related to this service. The patient expressed understanding and agreed to proceed.  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  Currently following a Modified [employer] plan   and exercising 3-4 times per week. Has lost 8.5 lbs since January 1 without medication. Eating multiple smaller meals daily.  Diet reviewed in detail.  Reviewed prior use of phentermine and the transient effects with subsequent weight regain.  Reviewed MOA of GLP 1 agonists and screened for contraindications to medication.     ROS: See pertinent positives and negatives per HPI.  Past Medical History:  Diagnosis Date   Abnormal weight gain    Anemia    Anxiety    Depression     Past Surgical History:  Procedure Laterality Date   ABLATION      uterine; approx 2017-2018   COLONOSCOPY WITH PROPOFOL N/A 04/01/2021   Procedure: COLONOSCOPY WITH PROPOFOL;  Surgeon: Toney Reil, MD;  Location: [facility];  Service: Endoscopy;  Laterality: N/A;   KNEE SURGERY Right    age 21   TONSILLECTOMY AND ADENOIDECTOMY  age 10    Family History  Problem Relation Age of Onset   Cancer Mother        had cervical CA, melanoma, then Stage 4 adeno CA of the lung   Cancer Paternal Grandmother        breat   Liver cancer Father     SOCIAL HX:  reports that she has never smoked. She has never used smokeless tobacco. She reports current alcohol use. She reports that she does not use drugs.    Current Outpatient Medications:    B-D INSULIN SYRINGE 1CC/25GX1" 25G X 1" 1 ML MISC, USE NEW SYRINGE WITH EACH B12 INJECTION, Disp: 13 each, Rfl: 4   cyanocobalamin (,VITAMIN B-12,) 1000 MCG/ML injection, FOR USE WITH WEEKLY B12 INJECTIONS, Disp: 13 mL, Rfl: 3   Magnesium-Potassium-Pyridox 100-99-200 MG CAPS, Take 1 tablet by mouth daily., Disp: , Rfl:    Multiple Vitamin (MULTIVITAMIN) tablet, Take 1 tablet by mouth daily., Disp: , Rfl:    valACYclovir (VALTREX) 1000 MG tablet, TAKE 2 TABLETS BY MOUTH TWICE DAILY AS NEEDED FOR COLD SORES, Disp: 20 tablet, Rfl: 3  EXAM:  VITALS per patient if applicable:  GENERAL: alert, oriented, appears well and in no acute distress  HEENT: atraumatic, conjunttiva clear, no obvious abnormalities on inspection of external nose and  ears  NECK: normal movements of the head and neck  LUNGS: on inspection no signs of respiratory distress, breathing rate appears normal, no obvious gross SOB, gasping or wheezing  CV: no obvious cyanosis  MS: moves all visible extremities without noticeable abnormality  PSYCH/NEURO: pleasant and cooperative, no obvious depression or anxiety, speech and thought processing grossly intact  ASSESSMENT AND PLAN:  Discussed the following assessment and plan:  B12 deficiency  - Plan: Vitamin B12  Impaired fasting glucose  Obesity (BMI 30.0-34.9)  Impaired fasting glucose Last A1c was 5.7 in 2021.  Asked to return for a1c and CMET   Obesity (BMI 30.0-34.9) Reviewed her previous success at weight loss,  Her goal weight for BMI < 30 and her current success.  Alternative plans reviewed,  Unfortunately GLP 1 agonists and Contrave are nor on formulary.  Will consider metformin if weight plateaus    I discussed the assessment and treatment plan with the patient. The patient was provided an opportunity to ask questions and all were answered. The patient agreed with the plan and demonstrated an understanding of the instructions.   The patient was advised to call back or seek an in-person evaluation if the symptoms worsen or if the condition fails to improve as anticipated.   I spent 20 minutes dedicated to the care of this patient on the date of this encounter to include pre-visit review of his medical history,  Face-to-face time with the patient , and post visit ordering of testing and therapeutics.    [name], MD

## 2021-09-01 NOTE — Assessment & Plan Note (Signed)
Reviewed her previous success at weight loss,  Her goal weight for BMI < 30 and her current success.  Alternative plans reviewed,  Unfortunately GLP 1 agonists and Contrave are nor on formulary.  Will consider metformin if weight plateaus

## 2021-09-01 NOTE — Assessment & Plan Note (Signed)
Last A1c was 5.7 in 2021.  Asked to return for a1c and CMET

## 2021-09-08 ENCOUNTER — Encounter: Payer: Self-pay | Admitting: Internal Medicine

## 2021-09-29 ENCOUNTER — Other Ambulatory Visit (INDEPENDENT_AMBULATORY_CARE_PROVIDER_SITE_OTHER): Payer: BC Managed Care – PPO

## 2021-09-29 ENCOUNTER — Other Ambulatory Visit: Payer: Self-pay

## 2021-09-29 DIAGNOSIS — E538 Deficiency of other specified B group vitamins: Secondary | ICD-10-CM | POA: Diagnosis not present

## 2021-09-29 DIAGNOSIS — E669 Obesity, unspecified: Secondary | ICD-10-CM

## 2021-09-29 DIAGNOSIS — R7301 Impaired fasting glucose: Secondary | ICD-10-CM

## 2021-09-29 LAB — COMPREHENSIVE METABOLIC PANEL
ALT: 12 U/L (ref 0–35)
AST: 14 U/L (ref 0–37)
Albumin: 4.3 g/dL (ref 3.5–5.2)
Alkaline Phosphatase: 63 U/L (ref 39–117)
BUN: 18 mg/dL (ref 6–23)
CO2: 26 mEq/L (ref 19–32)
Calcium: 8.9 mg/dL (ref 8.4–10.5)
Chloride: 102 mEq/L (ref 96–112)
Creatinine, Ser: 0.71 mg/dL (ref 0.40–1.20)
GFR: 99.75 mL/min (ref >60.00)
Glucose, Bld: 91 mg/dL (ref 70–99)
Potassium: 4.5 mEq/L (ref 3.5–5.1)
Sodium: 134 mEq/L — ABNORMAL LOW (ref 135–145)
Total Bilirubin: 0.6 mg/dL (ref 0.2–1.2)
Total Protein: 6.9 g/dL (ref 6.0–8.3)

## 2021-09-29 LAB — HEMOGLOBIN A1C: Hgb A1c MFr Bld: 5.3 % (ref 4.6–6.5)

## 2021-09-29 LAB — VITAMIN B12: Vitamin B-12: 543 pg/mL (ref 211–911)

## 2021-11-10 ENCOUNTER — Other Ambulatory Visit: Payer: Self-pay | Admitting: Internal Medicine

## 2021-12-06 ENCOUNTER — Encounter: Payer: Self-pay | Admitting: Internal Medicine

## 2021-12-08 MED ORDER — METFORMIN HCL ER 500 MG PO TB24: 500.0000 mg | ORAL_TABLET | Freq: Every day | ORAL | 1 refills | Status: DC

## 2021-12-14 ENCOUNTER — Other Ambulatory Visit: Payer: Self-pay | Admitting: Internal Medicine

## 2021-12-14 DIAGNOSIS — R5383 Other fatigue: Secondary | ICD-10-CM

## 2022-02-27 ENCOUNTER — Other Ambulatory Visit: Payer: Self-pay | Admitting: Internal Medicine

## 2022-04-13 ENCOUNTER — Encounter: Payer: Self-pay | Admitting: Internal Medicine

## 2022-04-18 ENCOUNTER — Telehealth (INDEPENDENT_AMBULATORY_CARE_PROVIDER_SITE_OTHER): Payer: BC Managed Care – PPO | Admitting: Internal Medicine

## 2022-04-18 ENCOUNTER — Encounter: Payer: Self-pay | Admitting: Internal Medicine

## 2022-04-18 DIAGNOSIS — R7301 Impaired fasting glucose: Secondary | ICD-10-CM | POA: Diagnosis not present

## 2022-04-18 DIAGNOSIS — E669 Obesity, unspecified: Secondary | ICD-10-CM | POA: Diagnosis not present

## 2022-04-18 DIAGNOSIS — E66811 Obesity, class 1: Secondary | ICD-10-CM

## 2022-04-18 MED ORDER — SEMAGLUTIDE (2 MG/DOSE) 8 MG/3ML ~~LOC~~ SOPN: 2.0000 mg | PEN_INJECTOR | SUBCUTANEOUS | 0 refills | Status: DC

## 2022-04-18 NOTE — Progress Notes (Signed)
Virtual Visit via Caregility   Note    This format is felt to be most appropriate for this patient at this time.  All issues noted in this document were discussed and addressed.  No physical exam was performed (except for noted visual exam findings with Video Visits).   I connected with  Kirstin on 04/18/22 at  1:30 PM EDT by a video enabled telemedicine application and verified that I am speaking with the correct person using two identifiers. Location patient: home Location provider: work or home office Persons participating in the virtual visit: patient, provider  I discussed the limitations, risks, security and privacy concerns of performing an evaluation and management service by telephone and the availability of in person appointments. I also discussed with the patient that there may be a patient responsible charge related to this service. The patient expressed understanding and agreed to proceed.  Reason for visit: OBESITY   HPI:  51 YR OLD female presents for follow up on obesity management.  Was prescribed metformin in May,  did not tolerate medication due to excessive diarrhea.  stopped after a few days.  Exercising regularly , but can't get below current weight.  Starting Green smoothie elimination diet today .  Would like to try a GLP 1 agonist .  No FH or PH of thyroid CA  , no PH of pancreatitis    ROS: See pertinent positives and negatives per HPI.  Past Medical History:  Diagnosis Date   Abnormal weight gain    Anemia    Anxiety    Depression     Past Surgical History:  Procedure Laterality Date   ABLATION     uterine; approx 2017-2018   COLONOSCOPY WITH PROPOFOL N/A 04/01/2021   Procedure: COLONOSCOPY WITH PROPOFOL;  Surgeon: Toney Reil, MD;  Location: [facility];  Service: Endoscopy;  Laterality: N/A;   KNEE SURGERY Right    age 49   TONSILLECTOMY AND ADENOIDECTOMY  age 85    Family History  Problem Relation Age of Onset   Cancer Mother         had cervical CA, melanoma, then Stage 4 adeno CA of the lung   Cancer Paternal Grandmother        breat   Liver cancer Father     SOCIAL HX:  reports that she has never smoked. She has never used smokeless tobacco. She reports current alcohol use. She reports that she does not use drugs.    Current Outpatient Medications:    B-D 3CC LUER-LOK SYR 25GX1" 25G X 1" 3 ML MISC, USE NEW SYRINGE WITH EACH B12 INJECTION, Disp: 13 each, Rfl: 4   B-D INSULIN SYRINGE 1CC/25GX1" 25G X 1" 1 ML MISC, USE NEW SYRINGE WITH EACH B12 INJECTION, Disp: 13 each, Rfl: 4   cyanocobalamin (,VITAMIN B-12,) 1000 MCG/ML injection, FOR USE WITH WEEKLY B12 INJECTIONS, Disp: 13 mL, Rfl: 3   Magnesium-Potassium-Pyridox 100-99-200 MG CAPS, Take 1 tablet by mouth daily., Disp: , Rfl:    Multiple Vitamin (MULTIVITAMIN) tablet, Take 1 tablet by mouth daily., Disp: , Rfl:    Semaglutide, 2 MG/DOSE, 8 MG/3ML SOPN, Inject 2 mg as directed once a week., Disp: 3 mL, Rfl: 0   valACYclovir (VALTREX) 1000 MG tablet, TAKE 2 TABLETS BY MOUTH TWICE DAILY AS NEEDED FOR COLD SORES, Disp: 20 tablet, Rfl: 3  EXAM:  VITALS per patient if applicable:  GENERAL: alert, oriented, appears well and in no acute distress  HEENT: atraumatic, conjunttiva clear,  no obvious abnormalities on inspection of external nose and ears  NECK: normal movements of the head and neck  LUNGS: on inspection no signs of respiratory distress, breathing rate appears normal, no obvious gross SOB, gasping or wheezing  CV: no obvious cyanosis  MS: moves all visible extremities without noticeable abnormality  PSYCH/NEURO: pleasant and cooperative, no obvious depression or anxiety, speech and thought processing grossly intact  ASSESSMENT AND PLAN:  Discussed the following assessment and plan:  Impaired fasting glucose  Obesity (BMI 30.0-34.9)  Impaired fasting glucose She has had elevated a1c in the past,  As high as 5.7  . She has no contraindications to  GLP 1 agonist therapy. TRIAL OF OZEMPIC STARTING AT 0.25 MG   Obesity (BMI 30.0-34.9) Reviewed her previous success at weight loss,  Her goal weight for BMI < 30 and her current success.  Alternative plans reviewed, she did not tolerate metformin.  Will prescribe ozempic at the2 mg dose (only dose covered by insurance with [place]) and convert dose to 0.25 mg starting dose with 19 clicks.  0.5 mg dose is 37 clicks.        I discussed the assessment and treatment plan with the patient. The patient was provided an opportunity to ask questions and all were answered. The patient agreed with the plan and demonstrated an understanding of the instructions.   The patient was advised to call back or seek an in-person evaluation if the symptoms worsen or if the condition fails to improve as anticipated.   I spent 20 minutes dedicated to the care of this patient on the date of this encounter to include pre-visit review of her medical history,  previous labs, Face-to-face time with the patient , and post visit ordering of testing and therapeutics.    [name], MD

## 2022-04-18 NOTE — Patient Instructions (Signed)
I am recommending an injectable medication to help curb your appetite,  It is called Ozempic.  It slows down your stomach emptying so you stay full longer .    TO GET THE STARTING DOSE OUT OF YOUR 2 MG DOSE PEN"  COUNT UP   TO 19 CLICKS:  THIS WILL  DELIVER THE 0.25 MG DOSE.  DO THIS WEEKLY FOR 4 WEEKS  INCREASE THE DOSE TO 0.5 MG  . THIS WILL MEAN YOU COUNT 37 CLICKS  AFTER 4 WEEKS ,

## 2022-04-18 NOTE — Assessment & Plan Note (Addendum)
She has had elevated a1c in the past,  As high as 5.7  . She has no contraindications to GLP 1 agonist therapy. TRIAL OF OZEMPIC STARTING AT 0.25 MG

## 2022-04-18 NOTE — Assessment & Plan Note (Signed)
Reviewed her previous success at weight loss,  Her goal weight for BMI < 30 and her current success.  Alternative plans reviewed, she did not tolerate metformin.  Will prescribe ozempic at the2 mg dose (only dose covered by insurance with [place]) and convert dose to 0.25 mg starting dose with 19 clicks.  0.5 mg dose is 37 clicks.

## 2022-05-16 ENCOUNTER — Other Ambulatory Visit: Payer: Self-pay | Admitting: Internal Medicine

## 2022-06-08 ENCOUNTER — Encounter: Payer: Self-pay | Admitting: Internal Medicine

## 2022-06-09 LAB — HM MAMMOGRAPHY

## 2022-08-10 ENCOUNTER — Encounter: Payer: Self-pay | Admitting: Internal Medicine

## 2022-08-12 MED ORDER — SEMAGLUTIDE (2 MG/DOSE) 8 MG/3ML ~~LOC~~ SOPN: 2.0000 mg | PEN_INJECTOR | SUBCUTANEOUS | 1 refills | Status: DC

## 2022-08-12 NOTE — Addendum Note (Signed)
Addended by: Crecencio Mc on: 08/12/2022 01:03 PM   Modules accepted: Orders

## 2022-08-12 NOTE — Telephone Encounter (Signed)
Patient will need extra needles for semaglutide sent to mail order or to local pharmacy.

## 2022-09-27 ENCOUNTER — Encounter: Payer: Self-pay | Admitting: Internal Medicine

## 2022-09-27 NOTE — Patient Instructions (Incomplete)
It was a pleasure meeting you today. Thank you for allowing me to take part in your health care.  Our goals for today as we discussed include:  Start Doxycycline 100 mg twice a day for 5 days Flonase 2 sprays daily Nettry pot daily Probiotics daily and continue for 2 weeks after completion of treatment  If no improvement in symptoms or recurrence recommend ENT referral  Refill Valtrex Refill Omeprazole   If you have any questions or concerns, please do not hesitate to call the office at [phone].  I look forward to our next visit and until then take care and stay safe.  Regards,   Dana Allan, MD   [facility]

## 2022-09-27 NOTE — Progress Notes (Signed)
Virtual Visit via Video note  I connected with Sheri Guerrero on 10/02/22 at 1350 by video and verified that I am speaking with the correct person using two identifiers. Sheri Guerrero is currently located at home and  is currently alone during visit. The provider, Carollee Leitz, MD is located in their office at time of visit.  I discussed the limitations, risks, security and privacy concerns of performing an evaluation and management service by video and the availability of in person appointments. I also discussed with the patient that there may be a patient responsible charge related to this service. The patient expressed understanding and agreed to proceed.  Subjective: PCP: Crecencio Mc, MD  Chief Complaint  Patient presents with   Acute Home Visit    Congesiton, laryngitis, stomach bug/ sinus pain X 2 months    HPI Cough, sinus pain and laryngitis since Nov 2023, on and off.  Symptoms of intermittent dry cough, sinus pain, upper teeth hurting started 1 week ago.  Endorses headache, subjective fevers and runny nose. Hydrating well.  Planning for trip to [place] in am and would like treatment prior to leaving.  Requesting refill for Omeprazole  Requesting refill for Valtrex.  ROS: Per HPI  Current Outpatient Medications:    B-D 3CC LUER-LOK SYR 25GX1" 25G X 1" 3 ML MISC, USE NEW SYRINGE WITH EACH B12 INJECTION, Disp: 13 each, Rfl: 4   B-D INSULIN SYRINGE 1CC/25GX1" 25G X 1" 1 ML MISC, USE NEW SYRINGE WITH EACH B12 INJECTION, Disp: 13 each, Rfl: 4   cyanocobalamin (,VITAMIN B-12,) 1000 MCG/ML injection, FOR USE WITH WEEKLY B12 INJECTIONS, Disp: 13 mL, Rfl: 3   doxycycline (VIBRA-TABS) 100 MG tablet, Take 1 tablet (100 mg total) by mouth 2 (two) times daily for 5 days., Disp: 10 tablet, Rfl: 0   fluticasone (FLONASE) 50 MCG/ACT nasal spray, Place 2 sprays into both nostrils daily., Disp: 16 g, Rfl: 6   Magnesium-Potassium-Pyridox 100-99-200 MG CAPS, Take 1 tablet by mouth  daily., Disp: , Rfl:    metFORMIN (GLUCOPHAGE-XR) 500 MG 24 hr tablet, TAKE 1 TABLET BY MOUTH EVERY DAY WITH BREAKFAST, Disp: , Rfl:    Multiple Vitamin (MULTIVITAMIN) tablet, Take 1 tablet by mouth daily., Disp: , Rfl:    omeprazole (PRILOSEC) 20 MG capsule, Take 1 capsule (20 mg total) by mouth daily., Disp: 30 capsule, Rfl: 0   saccharomyces boulardii (FLORASTOR) 250 MG capsule, Take 1 capsule (250 mg total) by mouth daily., Disp: 90 capsule, Rfl: 0   Semaglutide, 2 MG/DOSE, 8 MG/3ML SOPN, Inject 2 mg as directed once a week., Disp: 9 mL, Rfl: 1   valACYclovir (VALTREX) 1000 MG tablet, Take 2 tablets twice a day as needed for cold sore, Disp: 20 tablet, Rfl: 3  Observations/Objective: Physical Exam Pulmonary:     Effort: Pulmonary effort is normal.  Neurological:     Mental Status: She is alert and oriented to person, place, and time. Mental status is at baseline.  Psychiatric:        Mood and Affect: Mood normal.        Behavior: Behavior normal.        Thought Content: Thought content normal.        Judgment: Judgment normal.    Assessment and Plan: Acute recurrent maxillary sinusitis -     Fluticasone Propionate; Place 2 sprays into both nostrils daily.  Dispense: 16 g; Refill: 6 -     Doxycycline Hyclate; Take 1 tablet (100 mg total) by  mouth 2 (two) times daily for 5 days.  Dispense: 10 tablet; Refill: 0 -     Saccharomyces boulardii; Take 1 capsule (250 mg total) by mouth daily.  Dispense: 90 capsule; Refill: 0  Gastroesophageal reflux disease without esophagitis Assessment & Plan: Chronic.   Refill Omeprazole 20 mg daily  Orders: -     Omeprazole; Take 1 capsule (20 mg total) by mouth daily.  Dispense: 30 capsule; Refill: 0  Herpes labialis Assessment & Plan: Chronic.  No recent flare up of fever blisters.  Planning for trip to [place]. Refill medication  Orders: -     valACYclovir HCl; Take 2 tablets twice a day as needed for cold sore  Dispense: 20 tablet; Refill:  3    Follow Up Instructions: Return if symptoms worsen or fail to improve.   I discussed the assessment and treatment plan with the patient. The patient was provided an opportunity to ask questions and all were answered. The patient agreed with the plan and demonstrated an understanding of the instructions.   The patient was advised to call back or seek an in-person evaluation if the symptoms worsen or if the condition fails to improve as anticipated.  The above assessment and management plan was discussed with the patient. The patient verbalized understanding of and has agreed to the management plan. Patient is aware to call the clinic if symptoms persist or worsen. Patient is aware when to return to the clinic for a follow-up visit. Patient educated on when it is appropriate to go to the emergency department.   PDMP Reviewed  Carollee Leitz, MD

## 2022-09-28 ENCOUNTER — Encounter: Payer: Self-pay | Admitting: Family Medicine

## 2022-09-28 ENCOUNTER — Telehealth: Payer: BC Managed Care – PPO | Admitting: Family Medicine

## 2022-09-28 VITALS — BP 116/70 | Ht 63.75 in | Wt 180.5 lb

## 2022-09-28 DIAGNOSIS — K649 Unspecified hemorrhoids: Secondary | ICD-10-CM | POA: Insufficient documentation

## 2022-09-28 DIAGNOSIS — J0101 Acute recurrent maxillary sinusitis: Secondary | ICD-10-CM

## 2022-09-28 DIAGNOSIS — B001 Herpesviral vesicular dermatitis: Secondary | ICD-10-CM

## 2022-09-28 DIAGNOSIS — K219 Gastro-esophageal reflux disease without esophagitis: Secondary | ICD-10-CM | POA: Diagnosis not present

## 2022-09-28 MED ORDER — OMEPRAZOLE 20 MG PO CPDR: 20.0000 mg | DELAYED_RELEASE_CAPSULE | Freq: Every day | ORAL | 0 refills | Status: DC

## 2022-09-28 MED ORDER — VALACYCLOVIR HCL 1 G PO TABS: ORAL_TABLET | ORAL | 3 refills | Status: DC

## 2022-09-28 MED ORDER — DOXYCYCLINE HYCLATE 100 MG PO TABS: 100.0000 mg | ORAL_TABLET | Freq: Two times a day (BID) | ORAL | 0 refills | Status: AC

## 2022-09-28 MED ORDER — SACCHAROMYCES BOULARDII 250 MG PO CAPS: 250.0000 mg | ORAL_CAPSULE | Freq: Every day | ORAL | 0 refills | Status: DC

## 2022-09-28 MED ORDER — FLUTICASONE PROPIONATE 50 MCG/ACT NA SUSP: 2.0000 | Freq: Every day | NASAL | 6 refills | Status: DC

## 2022-10-02 ENCOUNTER — Encounter: Payer: Self-pay | Admitting: Family Medicine

## 2022-10-02 DIAGNOSIS — J0101 Acute recurrent maxillary sinusitis: Secondary | ICD-10-CM | POA: Insufficient documentation

## 2022-10-02 DIAGNOSIS — K219 Gastro-esophageal reflux disease without esophagitis: Secondary | ICD-10-CM | POA: Insufficient documentation

## 2022-10-02 NOTE — Assessment & Plan Note (Signed)
Chronic.   Refill Omeprazole 20 mg daily

## 2022-10-02 NOTE — Assessment & Plan Note (Signed)
Chronic.  No recent flare up of fever blisters.  Planning for trip to [place]. Refill medication

## 2022-10-18 ENCOUNTER — Other Ambulatory Visit: Payer: Self-pay

## 2022-10-18 ENCOUNTER — Other Ambulatory Visit (HOSPITAL_COMMUNITY): Payer: Self-pay

## 2022-10-18 ENCOUNTER — Telehealth: Payer: Self-pay | Admitting: Family Medicine

## 2022-10-18 DIAGNOSIS — K219 Gastro-esophageal reflux disease without esophagitis: Secondary | ICD-10-CM

## 2022-10-18 MED ORDER — OMEPRAZOLE 20 MG PO CPDR
20.0000 mg | DELAYED_RELEASE_CAPSULE | Freq: Every day | ORAL | 1 refills | Status: DC
  Filled 2022-10-18: qty 90, 90d supply, fill #0

## 2022-10-21 MED ORDER — OMEPRAZOLE 20 MG PO CPDR: 20.0000 mg | DELAYED_RELEASE_CAPSULE | Freq: Every day | ORAL | 1 refills | Status: DC

## 2022-10-21 NOTE — Addendum Note (Signed)
Addended by: Adair Laundry on: 10/21/2022 12:20 PM   Modules accepted: Orders

## 2022-10-21 NOTE — Telephone Encounter (Signed)
Medication has been refilled.

## 2022-10-21 NOTE — Telephone Encounter (Signed)
Pt need a refill on omeprazole sent to [facility] in [place]

## 2022-12-13 ENCOUNTER — Other Ambulatory Visit: Payer: Self-pay | Admitting: Internal Medicine

## 2023-02-17 ENCOUNTER — Telehealth: Payer: Self-pay | Admitting: Internal Medicine

## 2023-02-17 ENCOUNTER — Ambulatory Visit: Payer: BC Managed Care – PPO | Admitting: Internal Medicine

## 2023-02-17 ENCOUNTER — Encounter: Payer: Self-pay | Admitting: Internal Medicine

## 2023-02-17 VITALS — BP 120/80 | HR 78 | Temp 98.1°F | Resp 17 | Ht 63.75 in | Wt 167.0 lb

## 2023-02-17 DIAGNOSIS — R7301 Impaired fasting glucose: Secondary | ICD-10-CM

## 2023-02-17 DIAGNOSIS — Z6828 Body mass index (BMI) 28.0-28.9, adult: Secondary | ICD-10-CM | POA: Diagnosis not present

## 2023-02-17 DIAGNOSIS — K2950 Unspecified chronic gastritis without bleeding: Secondary | ICD-10-CM

## 2023-02-17 DIAGNOSIS — G44229 Chronic tension-type headache, not intractable: Secondary | ICD-10-CM

## 2023-02-17 DIAGNOSIS — K219 Gastro-esophageal reflux disease without esophagitis: Secondary | ICD-10-CM | POA: Diagnosis not present

## 2023-02-17 DIAGNOSIS — E538 Deficiency of other specified B group vitamins: Secondary | ICD-10-CM

## 2023-02-17 DIAGNOSIS — E669 Obesity, unspecified: Secondary | ICD-10-CM

## 2023-02-17 LAB — COMPREHENSIVE METABOLIC PANEL
ALT: 11 U/L (ref 0–35)
AST: 13 U/L (ref 0–37)
Albumin: 4.2 g/dL (ref 3.5–5.2)
Alkaline Phosphatase: 62 U/L (ref 39–117)
BUN: 14 mg/dL (ref 6–23)
CO2: 29 mEq/L (ref 19–32)
Calcium: 9.3 mg/dL (ref 8.4–10.5)
Chloride: 103 mEq/L (ref 96–112)
Creatinine, Ser: 0.71 mg/dL (ref 0.40–1.20)
GFR: 98.78 mL/min (ref >60.00)
Glucose, Bld: 87 mg/dL (ref 70–99)
Potassium: 4.3 mEq/L (ref 3.5–5.1)
Sodium: 139 mEq/L (ref 135–145)
Total Bilirubin: 0.6 mg/dL (ref 0.2–1.2)
Total Protein: 6.8 g/dL (ref 6.0–8.3)

## 2023-02-17 LAB — CBC WITH DIFFERENTIAL/PLATELET
Basophils Absolute: 0 10*3/uL (ref 0.0–0.1)
Basophils Relative: 0.4 % (ref 0.0–3.0)
Eosinophils Absolute: 0.2 10*3/uL (ref 0.0–0.7)
Eosinophils Relative: 3.3 % (ref 0.0–5.0)
HCT: 42.3 % (ref 36.0–46.0)
Hemoglobin: 14.4 g/dL (ref 12.0–15.0)
Lymphocytes Relative: 25.3 % (ref 12.0–46.0)
Lymphs Abs: 1.8 10*3/uL (ref 0.7–4.0)
MCHC: 34.1 g/dL (ref 30.0–36.0)
MCV: 95.6 fl (ref 78.0–100.0)
Monocytes Absolute: 0.5 10*3/uL (ref 0.1–1.0)
Monocytes Relative: 6.9 % (ref 3.0–12.0)
Neutro Abs: 4.5 10*3/uL (ref 1.4–7.7)
Neutrophils Relative %: 64.1 % (ref 43.0–77.0)
Platelets: 242 10*3/uL (ref 150.0–400.0)
RBC: 4.43 Mil/uL (ref 3.87–5.11)
RDW: 12.2 % (ref 11.5–15.5)
WBC: 7.1 10*3/uL (ref 4.0–10.5)

## 2023-02-17 LAB — HEMOGLOBIN A1C: Hgb A1c MFr Bld: 4.7 % (ref 4.6–6.5)

## 2023-02-17 LAB — LIPID PANEL
Cholesterol: 182 mg/dL (ref 0–200)
HDL: 48.1 mg/dL (ref >39.00)
LDL Cholesterol: 108 mg/dL — ABNORMAL HIGH (ref 0–99)
NonHDL: 133.63
Total CHOL/HDL Ratio: 4
Triglycerides: 129 mg/dL (ref 0.0–149.0)
VLDL: 25.8 mg/dL (ref 0.0–40.0)

## 2023-02-17 LAB — LDL CHOLESTEROL, DIRECT: Direct LDL: 104 mg/dL

## 2023-02-17 LAB — TSH: TSH: 0.41 u[IU]/mL (ref 0.35–5.50)

## 2023-02-17 LAB — H. PYLORI ANTIBODY, IGG: H Pylori IgG: NEGATIVE

## 2023-02-17 MED ORDER — "SYRINGE 25G X 1"" 3 ML MISC": 0 refills | Status: DC

## 2023-02-17 MED ORDER — PANTOPRAZOLE SODIUM 40 MG PO TBEC: 40.0000 mg | DELAYED_RELEASE_TABLET | Freq: Every day | ORAL | 1 refills | Status: DC

## 2023-02-17 MED ORDER — CYANOCOBALAMIN 1000 MCG/ML IJ SOLN: INTRAMUSCULAR | 3 refills | Status: DC

## 2023-02-17 MED ORDER — OZEMPIC (2 MG/DOSE) 8 MG/3ML ~~LOC~~ SOPN: PEN_INJECTOR | SUBCUTANEOUS | 0 refills | Status: DC

## 2023-02-17 NOTE — Patient Instructions (Addendum)
You can start weaning your ozempic dose  using the table below   IF YOU ARE USING THE 8 MG PEN (GOLD COLOR) THAT DELIVERS THE 2 MG WEEKLY DOSE  10 CLICKS   0.25 MG DOSE 19 CLICK     0.50 MG DOSE 37 CLICKS   1.0 MG DOSE 56 CLICKS   1.5 MG DOSE     Referrals to Dr Allegra Lai and Dr Lucia Gaskins are in process  Protonix sent to mail order.

## 2023-02-17 NOTE — Progress Notes (Unsigned)
Subjective:  Patient ID: Sheri Guerrero, female    DOB: Sep 21, 1970  Age: 52 y.o. MRN: [id number]  CC: The primary encounter diagnosis was Impaired fasting glucose. Diagnoses of Gastroesophageal reflux disease without esophagitis, Obesity (BMI 30.0-34.9), Chronic tension-type headache, not intractable, Chronic gastritis without bleeding, unspecified gastritis type, and B12 deficiency were also pertinent to this visit.   HPI Sheri Guerrero presents for  Chief Complaint  Patient presents with   Medical Management of Chronic Issues    10 mth follow-up   Last seen Sept 2023   1) obesity management:  taking ozempic ;  currently on the 2 mg dose ;  has lost 14 lbs since February.   Wants to start weaning her dose so she can maintain.   2) "stomach issues": she has a   remote history of gastric ulcer.  Never had a follow up EGD.    Gets full quickly,  then feels like her stomach "contracts"  about 30 minutes after eating.  Has been PPI dependent . Felt better on pantoprazole>>omeprazole  no recent EGD.   3) headaches:  described as  tension,  occurring daily.  Improved with massage of forehead and temples :  wants ro consider botoxt   Outpatient Medications Prior to Visit  Medication Sig Dispense Refill   fluticasone (FLONASE) 50 MCG/ACT nasal spray Place 2 sprays into both nostrils daily. 16 g 6   Magnesium-Potassium-Pyridox 100-99-200 MG CAPS Take 1 tablet by mouth daily.     Multiple Vitamin (MULTIVITAMIN) tablet Take 1 tablet by mouth daily.     omeprazole (PRILOSEC) 20 MG capsule Take 1 capsule (20 mg total) by mouth daily. 90 capsule 1   saccharomyces boulardii (FLORASTOR) 250 MG capsule Take 1 capsule (250 mg total) by mouth daily. 90 capsule 0   valACYclovir (VALTREX) 1000 MG tablet Take 2 tablets twice a day as needed for cold sore 20 tablet 3   B-D 3CC LUER-LOK SYR 25GX1" 25G X 1" 3 ML MISC USE NEW SYRINGE WITH EACH B12 INJECTION 13 each 4   B-D INSULIN SYRINGE 1CC/25GX1" 25G X 1"  1 ML MISC USE NEW SYRINGE WITH EACH B12 INJECTION 13 each 4   cyanocobalamin (,VITAMIN B-12,) 1000 MCG/ML injection FOR USE WITH WEEKLY B12 INJECTIONS 13 mL 3   metFORMIN (GLUCOPHAGE-XR) 500 MG 24 hr tablet TAKE 1 TABLET BY MOUTH EVERY DAY WITH BREAKFAST     Semaglutide, 2 MG/DOSE, (OZEMPIC, 2 MG/DOSE,) 8 MG/3ML SOPN INJECT 2MG  ONCE A WEEK AS  DIRECTED 9 mL 0   No facility-administered medications prior to visit.    Review of Systems;  Patient denies  fevers, malaise, unintentional weight loss, skin rash, eye pain, sinus congestion and sinus pain, sore throat, dysphagia,  hemoptysis , cough, dyspnea, wheezing, chest pain, palpitations, orthopnea, edema,  melena, diarrhea, flank pain, dysuria, hematuria, urinary  Frequency, nocturia, numbness, tingling, seizures,  Focal weakness, Loss of consciousness,  Tremor, insomnia, depression, anxiety, and suicidal ideation.      Objective:  BP 120/80   Pulse 78   Temp 98.1 F (36.7 C) (Oral)   Resp 17   Ht 5' 3.75" (1.619 m)   Wt 167 lb (75.8 kg)   SpO2 98%   BMI 28.89 kg/m   BP Readings from Last 3 Encounters:  02/17/23 120/80  09/28/22 116/70  04/01/21 106/73    Wt Readings from Last 3 Encounters:  02/17/23 167 lb (75.8 kg)  09/28/22 180 lb 8 oz (81.9 kg)  04/18/22 186 lb 6.4  oz (84.6 kg)    Physical Exam Vitals reviewed.  Constitutional:      General: She is not in acute distress.    Appearance: Normal appearance. She is normal weight. She is not ill-appearing, toxic-appearing or diaphoretic.  HENT:     Head: Normocephalic.  Eyes:     General: No scleral icterus.       Right eye: No discharge.        Left eye: No discharge.     Conjunctiva/sclera: Conjunctivae normal.  Cardiovascular:     Rate and Rhythm: Normal rate and regular rhythm.     Heart sounds: Normal heart sounds.  Pulmonary:     Effort: Pulmonary effort is normal. No respiratory distress.     Breath sounds: Normal breath sounds.  Musculoskeletal:         General: Normal range of motion.  Skin:    General: Skin is warm and dry.  Neurological:     General: No focal deficit present.     Mental Status: She is alert and oriented to person, place, and time. Mental status is at baseline.  Psychiatric:        Mood and Affect: Mood normal.        Behavior: Behavior normal.        Thought Content: Thought content normal.        Judgment: Judgment normal.    Lab Results  Component Value Date   HGBA1C 4.7 02/17/2023   HGBA1C 5.3 09/29/2021   HGBA1C 5.7 11/14/2016    Lab Results  Component Value Date   CREATININE 0.71 02/17/2023   CREATININE 0.71 09/29/2021   CREATININE 0.62 10/21/2020    Lab Results  Component Value Date   WBC 7.1 02/17/2023   HGB 14.4 02/17/2023   HCT 42.3 02/17/2023   PLT 242.0 02/17/2023   GLUCOSE 87 02/17/2023   CHOL 182 02/17/2023   TRIG 129.0 02/17/2023   HDL 48.10 02/17/2023   LDLDIRECT 104.0 02/17/2023   LDLCALC 108 (H) 02/17/2023   ALT 11 02/17/2023   AST 13 02/17/2023   NA 139 02/17/2023   K 4.3 02/17/2023   CL 103 02/17/2023   CREATININE 0.71 02/17/2023   BUN 14 02/17/2023   CO2 29 02/17/2023   TSH 0.41 02/17/2023   HGBA1C 4.7 02/17/2023    No results found.  Assessment & Plan:  .Impaired fasting glucose -     Hemoglobin A1c -     Comprehensive metabolic panel  Gastroesophageal reflux disease without esophagitis  Obesity (BMI 30.0-34.9) Assessment & Plan: She has reached her goal weight at 167 lbs and her  BMI < 30 ausing 2 mg ozempic. she did not tolerate metformin.  She is using  ozempic at the 2 mg dose (only dose covered by insurance with [place]) and convert dose to 1.0 mg   Orders: -     Lipid panel -     LDL cholesterol, direct -     TSH -     CBC with Differential/Platelet  Chronic tension-type headache, not intractable -     Ambulatory referral to Neurology  Chronic gastritis without bleeding, unspecified gastritis type Assessment & Plan: Recurrent with negative screen for  H Pylori.  Schaning PPI to protonix   GI referral made to confirm resolution of gastric ulcer found I n 2013  Orders: -     Ambulatory referral to Gastroenterology -     H. pylori antibody, IgG -     Fecal occult blood, imunochemical;  Future  B12 deficiency Assessment & Plan: Likely secondary to chronic use of PPI.  Continue supplementing   Lab Results  Component Value Date   VITAMINB12 543 09/29/2021      Other orders -     Cyanocobalamin; FOR USE WITH WEEKLY B12 INJECTIONS  Dispense: 13 mL; Refill: 3 -     Ozempic (2 MG/DOSE); INJECT 2MG  ONCE A WEEK AS  DIRECTED  Dispense: 9 mL; Refill: 0 -     Pantoprazole Sodium; Take 1 tablet (40 mg total) by mouth daily.  Dispense: 90 tablet; Refill: 1 -     Syringe; Use for b12 injections  Dispense: 50 each; Refill: 0    Follow-up: No follow-ups on file.   [name], MD

## 2023-02-17 NOTE — Assessment & Plan Note (Signed)
She has reached her goal weight at 167 lbs and her  BMI < 30 ausing 2 mg ozempic. she did not tolerate metformin.  She is using  ozempic at the 2 mg dose (only dose covered by insurance with [place]) and convert dose to 1.0 mg

## 2023-02-19 NOTE — Assessment & Plan Note (Signed)
Recurrent with negative screen for H Pylori.  Schaning PPI to protonix   GI referral made to confirm resolution of gastric ulcer found I n 2013

## 2023-02-19 NOTE — Assessment & Plan Note (Signed)
Likely secondary to chronic use of PPI.  Continue supplementing   Lab Results  Component Value Date   VITAMINB12 543 09/29/2021

## 2023-02-21 NOTE — Telephone Encounter (Signed)
[facility] pharmacy called stating they need clarification on directions for this medication-cyanocobalamin and how the pt is taking it. Phone # [phone] Reference# [phone]

## 2023-02-21 NOTE — Telephone Encounter (Signed)
Clarification on directions for use was given to pharmacy.

## 2023-02-24 ENCOUNTER — Encounter: Payer: Self-pay | Admitting: Neurology

## 2023-02-24 ENCOUNTER — Ambulatory Visit: Payer: BC Managed Care – PPO | Admitting: Neurology

## 2023-02-24 VITALS — BP 108/75 | HR 84 | Ht 63.75 in | Wt 168.6 lb

## 2023-02-24 DIAGNOSIS — G43711 Chronic migraine without aura, intractable, with status migrainosus: Secondary | ICD-10-CM | POA: Diagnosis not present

## 2023-02-24 MED ORDER — AJOVY 225 MG/1.5ML ~~LOC~~ SOAJ: 225.0000 mg | SUBCUTANEOUS | 11 refills | Status: DC

## 2023-02-24 MED ORDER — FREMANEZUMAB-VFRM 225 MG/1.5ML ~~LOC~~ SOSY
225.0000 mg | PREFILLED_SYRINGE | Freq: Once | SUBCUTANEOUS | Status: DC
Start: 2023-02-24 — End: 2023-12-27

## 2023-02-24 MED ORDER — ONDANSETRON 4 MG PO TBDP
4.0000 mg | ORAL_TABLET | Freq: Three times a day (TID) | ORAL | 3 refills | Status: DC | PRN
Start: 2023-02-24 — End: 2023-09-13

## 2023-02-24 MED ORDER — RIZATRIPTAN BENZOATE 10 MG PO TBDP: 10.0000 mg | ORAL_TABLET | ORAL | 11 refills | Status: DC | PRN

## 2023-02-24 NOTE — Progress Notes (Unsigned)
[facility]    Provider:  Dr Lucia Gaskins Requesting Provider: [name], MD Primary Care Provider:  [name], MD  CC:  Headaches  HPI:  Sheri Guerrero is a 52 y.o. female here as requested by [name], MD for Headaches. She has a dull everyday headaches. In the temples and across the forehead. Putting pressure on the forheade helps. No waking up with it. She notices it more on the way home after using her computer all day. Stress, tension, clenching her jaw when she is stressed and she make a lot of facial expressionas and her forehead hurts. She has light sensitivity. Sound sensitivity. Photo/phonophobia. +nausea, pulsating/pounding/throbbing. Can be moderate to severe and put her in bed, under covers in a dark room, no vomiting, can be moderate to severe. Daily headaches. She has a lot of neck pain. She gets dry needling. She clenches and it makes her migraines worse. No aura. No medication overuse. > 10 moderate/severe migraines a month last 12-24 hours. Ibuprofen helps a little so she doesn't take any analgesics. Started 20 years ago and ongoing at this frequency and severity for 3 years and worsening.  She has had her eyes checked, tried removig foods, has been the care of pcp, has lost weight, slowly progressive.   Reviewed notes, labs and imaging from outside physicians, which showed:  Meds tried > 2 months: tylenol, magnesium, zofran, prednisone, phenergan, amitriptyline, topiramate, BP medications are contraindicated due to hypotension usually runs systolic about 100 so cannot take propranolol. Sumatriptan. Aimovig contraindicated due to constipation. On Ajovy.  Recent Results (from the past 2160 hour(s))  HgB A1c     Status: None   Collection Time: 02/17/23 11:01 AM  Result Value Ref Range   Hgb A1c MFr Bld 4.7 4.6 - 6.5 %    Comment: Glycemic Control Guidelines for People with Diabetes:Non Diabetic:  <6%Goal of Therapy: <7%Additional Action  Suggested:  >8%   Comp Met (CMET)     Status: None   Collection Time: 02/17/23 11:01 AM  Result Value Ref Range   Sodium 139 135 - 145 mEq/L   Potassium 4.3 3.5 - 5.1 mEq/L   Chloride 103 96 - 112 mEq/L   CO2 29 19 - 32 mEq/L   Glucose, Bld 87 70 - 99 mg/dL   BUN 14 6 - 23 mg/dL   Creatinine, Ser 5.28 0.40 - 1.20 mg/dL   Total Bilirubin 0.6 0.2 - 1.2 mg/dL   Alkaline Phosphatase 62 39 - 117 U/L   AST 13 0 - 37 U/L   ALT 11 0 - 35 U/L   Total Protein 6.8 6.0 - 8.3 g/dL   Albumin 4.2 3.5 - 5.2 g/dL   GFR 41.32 >44.01 mL/min    Comment: Calculated using the CKD-EPI Creatinine Equation (2021)   Calcium 9.3 8.4 - 10.5 mg/dL  Lipid Profile     Status: Abnormal   Collection Time: 02/17/23 11:01 AM  Result Value Ref Range   Cholesterol 182 0 - 200 mg/dL    Comment: ATP III Classification       Desirable:  < 200 mg/dL               Borderline High:  200 - 239 mg/dL          High:  > = 027 mg/dL   Triglycerides 253.6 0.0 - 149.0 mg/dL    Comment: Normal:  <644 mg/dLBorderline High:  150 - 199 mg/dL   HDL 03.47 >42.59 mg/dL  VLDL 25.8 0.0 - 40.0 mg/dL   LDL Cholesterol 284 (H) 0 - 99 mg/dL   Total CHOL/HDL Ratio 4     Comment:                Men          Women1/2 Average Risk     3.4          3.3Average Risk          5.0          4.42X Average Risk          9.6          7.13X Average Risk          15.0          11.0                       NonHDL 133.63     Comment: NOTE:  Non-HDL goal should be 30 mg/dL higher than patient's LDL goal (i.e. LDL goal of < 70 mg/dL, would have non-HDL goal of < 100 mg/dL)  Direct LDL     Status: None   Collection Time: 02/17/23 11:01 AM  Result Value Ref Range   Direct LDL 104.0 mg/dL    Comment: Optimal:  <132 mg/dLNear or Above Optimal:  100-129 mg/dLBorderline High:  130-159 mg/dLHigh:  160-189 mg/dLVery High:  >190 mg/dL  TSH     Status: None   Collection Time: 02/17/23 11:01 AM  Result Value Ref Range   TSH 0.41 0.35 - 5.50 uIU/mL  CBC with  Differential/Platelet     Status: None   Collection Time: 02/17/23 11:01 AM  Result Value Ref Range   WBC 7.1 4.0 - 10.5 K/uL   RBC 4.43 3.87 - 5.11 Mil/uL   Hemoglobin 14.4 12.0 - 15.0 g/dL   HCT 44.0 10.2 - 72.5 %   MCV 95.6 78.0 - 100.0 fl   MCHC 34.1 30.0 - 36.0 g/dL   RDW 36.6 44.0 - 34.7 %   Platelets 242.0 150.0 - 400.0 K/uL   Neutrophils Relative % 64.1 43.0 - 77.0 %   Lymphocytes Relative 25.3 12.0 - 46.0 %   Monocytes Relative 6.9 3.0 - 12.0 %   Eosinophils Relative 3.3 0.0 - 5.0 %   Basophils Relative 0.4 0.0 - 3.0 %   Neutro Abs 4.5 1.4 - 7.7 K/uL   Lymphs Abs 1.8 0.7 - 4.0 K/uL   Monocytes Absolute 0.5 0.1 - 1.0 K/uL   Eosinophils Absolute 0.2 0.0 - 0.7 K/uL   Basophils Absolute 0.0 0.0 - 0.1 K/uL  H. pylori antibody, IgG     Status: None   Collection Time: 02/17/23 11:01 AM  Result Value Ref Range   H Pylori IgG Negative Negative     DG cervical spine: 10/22/2020  CLINICAL DATA:  Chronic neck pain, frontal headache   EXAM: CERVICAL SPINE - COMPLETE 4+ VIEW   COMPARISON:  None.   FINDINGS: Normal alignment. No acute osseous finding or fracture. Normal prevertebral soft tissues. Moderately severe degenerative disc disease at C5-6 with disc space narrowing, sclerosis and endplate osteophytes. Facets are aligned. Intact odontoid. Dental hardware noted.   Trachea midline.  Lung apices are clear.   IMPRESSION: Moderate C5-6 degenerative disc disease. No other acute finding by plain radiography     Electronically Signed   By: Judie Petit.  Shick M.D.   On: 10/22/2020 14:44  Review of Systems: Patient complains of symptoms per  HPI as well as the following symptoms neck pain. Pertinent negatives and positives per HPI. All others negative.   Social History   Socioeconomic History   Marital status: Married    Spouse name: Not on file   Number of children: 1   Years of education: Not on file   Highest education level: Master's degree (e.g., MA, MS, MEng, MEd,  MSW, MBA)  Occupational History   Occupation: PT at [employer]    Comment: FT caregiver for parents  Tobacco Use   Smoking status: Never   Smokeless tobacco: Never  Vaping Use   Vaping status: Never Used  Substance and Sexual Activity   Alcohol use: Yes    Alcohol/week: 1.0 standard drink of alcohol    Types: 1 Standard drinks or equivalent per week    Comment: occassional, one a week   Drug use: No   Sexual activity: Yes    Birth control/protection: None  Other Topics Concern   Not on file  Social History Narrative   Lives with spouse   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   Social Determinants of Health   Financial Resource Strain: Not on file  Food Insecurity: Not on file  Transportation Needs: Not on file  Physical Activity: Not on file  Stress: Not on file  Social Connections: Not on file  Intimate Partner Violence: Not on file    Family History  Problem Relation Age of Onset   Cancer Mother        had cervical CA, melanoma, then Stage 4 adeno CA of the lung   Stroke Father    Liver cancer Father    Cancer Paternal Grandmother        breat    Past Medical History:  Diagnosis Date   Abnormal weight gain    Anemia    Anxiety    Depression     Patient Active Problem List   Diagnosis Date Noted   Gastroesophageal reflux disease without esophagitis 10/02/2022   Acute recurrent maxillary sinusitis 10/02/2022   Hemorrhoids 09/28/2022   Herpes labialis 09/28/2022   Encounter for screening colonoscopy    Family history of colon cancer requiring screening colonoscopy    Heart murmur 11/26/2020   Irritable bowel syndrome 11/26/2020   Personal history of COVID-19 05/01/2020   Exposure to 2019-nCoV 04/22/2020   Impaired fasting glucose 11/16/2016   Encounter for preventive health examination 11/16/2016   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   B12 deficiency 05/06/2014   Gastritis 03/26/2012   Obesity (BMI 30.0-34.9) 04/21/2011   Screening for breast cancer  04/21/2011    Past Surgical History:  Procedure Laterality Date   ABLATION     uterine; approx 2017-2018   COLONOSCOPY WITH PROPOFOL N/A 04/01/2021   Procedure: COLONOSCOPY WITH PROPOFOL;  Surgeon: Toney Reil, MD;  Location[facility];  Service: Endoscopy;  Laterality: N/A;   KNEE SURGERY Right    age 78   TONSILLECTOMY AND ADENOIDECTOMY  age 68    Current Outpatient Medications  Medication Sig Dispense Refill   cyanocobalamin (VITAMIN B12) 1000 MCG/ML injection Inject 1 ml intramuscularly once weekly. 13 mL 3   fluticasone (FLONASE) 50 MCG/ACT nasal spray Place 2 sprays into both nostrils daily. 16 g 6   Magnesium-Potassium-Pyridox 100-99-200 MG CAPS Take 1 tablet by mouth daily.     Multiple Vitamin (MULTIVITAMIN) tablet Take 1 tablet by mouth daily.     pantoprazole (PROTONIX) 40 MG tablet Take 1 tablet (40 mg  total) by mouth daily. 90 tablet 1   saccharomyces boulardii (FLORASTOR) 250 MG capsule Take 1 capsule (250 mg total) by mouth daily. 90 capsule 0   Semaglutide, 2 MG/DOSE, (OZEMPIC, 2 MG/DOSE,) 8 MG/3ML SOPN INJECT 2MG  ONCE A WEEK AS  DIRECTED 9 mL 0   Syringe/Needle, Disp, (SYRINGE 3CC/25GX1") 25G X 1" 3 ML MISC Use for b12 injections 50 each 0   valACYclovir (VALTREX) 1000 MG tablet Take 2 tablets twice a day as needed for cold sore 20 tablet 3   No current facility-administered medications for this visit.    Allergies as of 02/24/2023   (No Known Allergies)    Vitals: BP 108/75   Pulse 84   Ht 5' 3.75" (1.619 m)   Wt 168 lb 9.6 oz (76.5 kg)   BMI 29.17 kg/m  Last Weight:  Wt Readings from Last 1 Encounters:  02/24/23 168 lb 9.6 oz (76.5 kg)   Last Height:   Ht Readings from Last 1 Encounters:  02/24/23 5' 3.75" (1.619 m)     Physical exam: Exam: Gen: NAD, conversant, well nourised, obese, well groomed                     CV: RRR, no MRG. No Carotid Bruits. No peripheral edema, warm, nontender Eyes: Conjunctivae clear without  exudates or hemorrhage  Neuro: Detailed Neurologic Exam  Speech:    Speech is normal; fluent and spontaneous with normal comprehension.  Cognition:    The patient is oriented to person, place, and time;     recent and remote memory intact;     language fluent;     normal attention, concentration,     fund of knowledge Cranial Nerves:    The pupils are equal, round, and reactive to light. The fundi are normal and spontaneous venous pulsations are present. Visual fields are full to finger confrontation. Extraocular movements are intact. Trigeminal sensation is intact and the muscles of mastication are normal. The face is symmetric. The palate elevates in the midline. Hearing intact. Voice is normal. Shoulder shrug is normal. The tongue has normal motion without fasciculations.   Coordination:    Normal finger to nose and heel to shin. Normal rapid alternating movements.   Gait:    Heel-toe and tandem gait are normal.   Motor Observation:    No asymmetry, no atrophy, and no involuntary movements noted. Tone:    Normal muscle tone.    Posture:    Posture is normal. normal erect    Strength:    Strength is V/V in the upper and lower limbs.      Sensation: intact to LT     Reflex Exam:  DTR's:    Deep tendon reflexes in the upper and lower extremities are normal bilaterally.   Toes:    The toes are downgoing bilaterally.   Clonus:    Clonus is absent.    Assessment/Plan:    Start ajovy. Next go to botox.  MRI brain: hold off but low threshold.   No orders of the defined types were placed in this encounter.  No orders of the defined types were placed in this encounter.   Cc: [name], MD,  [name], MD  Naomie Dean, MD  [facility] [address] [place], [place] [address]  Phone [phone] Fax [phone]

## 2023-02-24 NOTE — Patient Instructions (Addendum)
Meds tried > 2 months: tylenol, magnesium, zofran, prednisone, phenergan, amitriptyline(caused sedation), topiramate(cognitive problems and tingling), BP medications are contraindicated due to hypotension usually runs systolic about 100 so cannot take propranolol. Sumatriptan(didn't help). Rizatriptan. Aimovig contraindicated because of constipation.   Start Ajovy as preventative once monthly. Suggest Botox for migraines next. Acute management: Rizatriptan and ondansetron. Next we coud try Ubrelvy or nurtec.or zavzpret  Fremanezumab Injection What is this medication? FREMANEZUMAB (fre ma NEZ ue mab) prevents migraines. It works by blocking a substance in the body that causes migraines. It is a monoclonal antibody. This medicine may be used for other purposes; ask your health care provider or pharmacist if you have questions. COMMON BRAND NAME(S): AJOVY What should I tell my care team before I take this medication? They need to know if you have any of these conditions: An unusual or allergic reaction to fremanezumab, other medications, foods, dyes, or preservatives Pregnant or trying to get pregnant Breast-feeding How should I use this medication? This medication is injected under the skin. You will be taught how to prepare and give it. Take it as directed on the prescription label. Keep taking it unless your care team tells you to stop. It is important that you put your used needles and syringes in a special sharps container. Do not put them in a trash can. If you do not have a sharps container, call your pharmacist or care team to get one. Talk to your care team about the use of this medication in children. Special care may be needed. Overdosage: If you think you have taken too much of this medicine contact a poison control center or emergency room at once. NOTE: This medicine is only for you. Do not share this medicine with others. What if I miss a dose? If you miss a dose, take it as soon as  you can. If it is almost time for your next dose, take only that dose. Do not take double or extra doses. What may interact with this medication? Interactions are not expected. This list may not describe all possible interactions. Give your health care provider a list of all the medicines, herbs, non-prescription drugs, or dietary supplements you use. Also tell them if you smoke, drink alcohol, or use illegal drugs. Some items may interact with your medicine. What should I watch for while using this medication? Tell your care team if your symptoms do not start to get better or if they get worse. What side effects may I notice from receiving this medication? Side effects that you should report to your care team as soon as possible: Allergic reactions or angioedema--skin rash, itching or hives, swelling of the face, eyes, lips, tongue, arms, or legs, trouble swallowing or breathing Side effects that usually do not require medical attention (report to your care team if they continue or are bothersome): Pain, redness, or irritation at injection site This list may not describe all possible side effects. Call your doctor for medical advice about side effects. You may report side effects to FDA at 1-800-FDA-1088. Where should I keep my medication? Keep out of the reach of children and pets. Store in a refrigerator or at room temperature between 20 and 25 degrees C (68 and 77 degrees F). Refrigeration (preferred): Store in the refrigerator. Do not freeze. Keep in the original container until you are ready to take it. Remove the dose from the carton about 30 minutes before it is time for you to use it. If the dose is  not used, it may be stored in the original container at room temperature for 7 days. Get rid of any unused medication after the expiration date. Room Temperature: This medication may be stored at room temperature for up to 7 days. Keep it in the original container. Protect from light until time  of use. If it is stored at room temperature, get rid of any unused medication after 7 days or after it expires, whichever is first. To get rid of medications that are no longer needed or have expired: Take the medication to a medication take-back program. Check with your pharmacy or law enforcement to find a location. If you cannot return the medication, ask your pharmacist or care team how to get rid of this medication safely. NOTE: This sheet is a summary. It may not cover all possible information. If you have questions about this medicine, talk to your doctor, pharmacist, or health care provider.  2024 Elsevier/Gold Standard (2021-09-17 00:00:00)  Ondansetron Dissolving Tablets What is this medication? ONDANSETRON (on DAN se tron) prevents nausea and vomiting from chemotherapy, radiation, or surgery. It works by blocking substances in the body that may cause nausea or vomiting. It belongs to a group of medications called antiemetics. This medicine may be used for other purposes; ask your health care provider or pharmacist if you have questions. COMMON BRAND NAME(S): Zofran ODT What should I tell my care team before I take this medication? They need to know if you have any of these conditions: Heart disease Irregular heartbeat or rhythm Liver disease Low levels of magnesium or potassium in the blood An unusual or allergic reaction to ondansetron, other medications, foods, dyes, or preservatives Pregnant or trying to get pregnant Breastfeeding How should I use this medication? Take this medication by mouth. Take it as directed on the prescription label at the same time every day. You do not need water to take this medication. Leave the tablet in the sealed pack until you are ready to take it. With dry hands, open the pack and gently remove the tablet. Place the tablet in the mouth and allow it to dissolve. Then, swallow it. Talk to your care team about the use of this medication in children.  Special care may be needed. Overdosage: If you think you have taken too much of this medicine contact a poison control center or emergency room at once. NOTE: This medicine is only for you. Do not share this medicine with others. What if I miss a dose? If you miss a dose, take it as soon as you can. If it is almost time for your next dose, take only that dose. Do not take double or extra doses. What may interact with this medication? Do not take this medication with any of the following: Apomorphine Certain medications for fungal infections, such as fluconazole, ketoconazole, posaconazole Cisapride Dronedarone Levoketoconazole Pimozide Quinidine Thioridazine This medication may also interact with the following: Certain medications for depression, anxiety, or other mental health conditions Certain medications for migraines, such as sumatriptan Linezolid Methylene blue Opioids Other medications that cause heart rhythm changes, such as dofetilide or ziprasidone St. John's wort Stimulant medications for ADHD, weight loss, or staying awake Tryptophan This list may not describe all possible interactions. Give your health care provider a list of all the medicines, herbs, non-prescription drugs, or dietary supplements you use. Also tell them if you smoke, drink alcohol, or use illegal drugs. Some items may interact with your medicine. What should I watch for while using  this medication? Check with your care team as soon as you can if you have any sign of an allergic reaction. What side effects may I notice from receiving this medication? Side effects that you should report to your care team as soon as possible: Allergic reactions--skin rash, itching, hives, swelling of the face, lips, tongue, or throat Bowel blockage--stomach cramping, unable to have a bowel movement or pass gas, loss of appetite, vomiting Chest pain (angina)--pain, pressure, or tightness in the chest, neck, back, or  arms Heart rhythm changes--fast or irregular heartbeat, dizziness, feeling faint or lightheaded, chest pain, trouble breathing Irritability, confusion, fast or irregular heartbeat, muscle stiffness, twitching muscles, sweating, high fever, seizure, chills, vomiting, diarrhea, which may be signs of serotonin syndrome Side effects that usually do not require medical attention (report to your care team if they continue or are bothersome): Constipation Diarrhea General discomfort and fatigue Headache This list may not describe all possible side effects. Call your doctor for medical advice about side effects. You may report side effects to FDA at 1-800-FDA-1088. Where should I keep my medication? Keep out of the reach of children and pets. Store between 2 and 30 degrees C (36 and 86 degrees F). Throw away any unused medication after the expiration date. NOTE: This sheet is a summary. It may not cover all possible information. If you have questions about this medicine, talk to your doctor, pharmacist, or health care provider.  2024 Elsevier/Gold Standard (2022-09-28 00:00:00) Rizatriptan Disintegrating Tablets What is this medication? RIZATRIPTAN (rye za TRIP tan) treats migraines. It works by blocking pain signals and narrowing blood vessels in the brain. It belongs to a group of medications called triptans. It is not used to prevent migraines. This medicine may be used for other purposes; ask your health care provider or pharmacist if you have questions. COMMON BRAND NAME(S): Maxalt-MLT What should I tell my care team before I take this medication? They need to know if you have any of these conditions: Circulation problems in fingers and toes Diabetes Heart disease High blood pressure High cholesterol History of irregular heartbeat History of stroke Stomach or intestine problems Tobacco use An unusual or allergic reaction to rizatriptan, other medications, foods, dyes, or  preservatives Pregnant or trying to get pregnant Breast-feeding How should I use this medication? Take this medication by mouth. Take it as directed on the prescription label. You do not need water to take this medication. Leave the tablet in the sealed pack until you are ready to take it. With dry hands, open the pack and gently remove the tablet. If the tablet breaks or crumbles, throw it away. Use a new tablet. Place the tablet on the tongue and allow it to dissolve. Then, swallow it. Do not cut, crush, or chew this medication. Do not use it more often than directed. Talk to your care team about the use of this medication in children. While it may be prescribed for children as young as 6 years for selected conditions, precautions do apply. Overdosage: If you think you have taken too much of this medicine contact a poison control center or emergency room at once. NOTE: This medicine is only for you. Do not share this medicine with others. What if I miss a dose? This does not apply. This medication is not for regular use. What may interact with this medication? Do not take this medication with any of the following: Ergot alkaloids, such as dihydroergotamine, ergotamine MAOIs, such as Marplan, Nardil, Parnate Other medications for  migraine headache, such as almotriptan, eletriptan, frovatriptan, naratriptan, sumatriptan, zolmitriptan This medication may also interact with the following: Certain medications for depression, anxiety, or other mental health conditions Propranolol This list may not describe all possible interactions. Give your health care provider a list of all the medicines, herbs, non-prescription drugs, or dietary supplements you use. Also tell them if you smoke, drink alcohol, or use illegal drugs. Some items may interact with your medicine. What should I watch for while using this medication? Visit your care team for regular checks on your progress. Tell your care team if your  symptoms do not start to get better or if they get worse. This medication may affect your coordination, reaction time, or judgment. Do not drive or operate machinery until you know how this medication affects you. Sit up or stand slowly to reduce the risk of dizzy or fainting spells. If you take migraine medications for 10 or more days a month, your migraines may get worse. Keep a diary of headache days and medication use. Contact your care team if your migraine attacks occur more frequently. What side effects may I notice from receiving this medication? Side effects that you should report to your care team as soon as possible: Allergic reactions--skin rash, itching, hives, swelling of the face, lips, tongue, or throat Burning, pain, tingling, or color changes in the hands, arms, legs, or feet Heart attack--pain or tightness in the chest, shoulders, arms, or jaw, nausea, shortness of breath, cold or clammy skin, feeling faint or lightheaded Heart rhythm changes--fast or irregular heartbeat, dizziness, feeling faint or lightheaded, chest pain, trouble breathing Increase in blood pressure Irritability, confusion, fast or irregular heartbeat, muscle stiffness, twitching muscles, sweating, high fever, seizure, chills, vomiting, diarrhea, which may be signs of serotonin syndrome Raynaud syndrome--cool, numb, or painful fingers or toes that may change color from pale, to blue, to red Seizures Stroke--sudden numbness or weakness of the face, arm, or leg, trouble speaking, confusion, trouble walking, loss of balance or coordination, dizziness, severe headache, change in vision Sudden or severe stomach pain, bloody diarrhea, fever, nausea, vomiting Vision loss Side effects that usually do not require medical attention (report to your care team if they continue or are bothersome): Dizziness Unusual weakness or fatigue This list may not describe all possible side effects. Call your doctor for medical advice  about side effects. You may report side effects to FDA at 1-800-FDA-1088. Where should I keep my medication? Keep out of the reach of children and pets. Store at room temperature between 15 and 30 degrees C (59 and 86 degrees F). Protect from light and moisture. Get rid of any unused medication after the expiration date. To get rid of medications that are no longer needed or have expired: Take the medication to a medication take-back program. Check with your pharmacy or law enforcement to find a location. If you cannot return the medication, check the label or package insert to see if the medication should be thrown out in the garbage or flushed down the toilet. If you are not sure, ask your care team. If it is safe to put it in the trash, empty the medication out of the container. Mix the medication with cat litter, dirt, coffee grounds, or other unwanted substance. Seal the mixture in a bag or container. Put it in the trash. NOTE: This sheet is a summary. It may not cover all possible information. If you have questions about this medicine, talk to your doctor, pharmacist, or health care  provider.  2024 Elsevier/Gold Standard (2021-11-25 00:00:00)

## 2023-02-26 ENCOUNTER — Encounter: Payer: Self-pay | Admitting: Neurology

## 2023-02-28 ENCOUNTER — Encounter: Payer: Self-pay | Admitting: Neurology

## 2023-03-02 ENCOUNTER — Telehealth: Payer: Self-pay | Admitting: *Deleted

## 2023-03-02 DIAGNOSIS — G43711 Chronic migraine without aura, intractable, with status migrainosus: Secondary | ICD-10-CM

## 2023-03-02 NOTE — Telephone Encounter (Signed)
Chronic Migraine CPT 64615  Botox J0585 Units:200  G43.711 Chronic Migraine without aura, intractable, with status migrainous  

## 2023-03-02 NOTE — Telephone Encounter (Signed)
Submitted benefit verification ([id number]), will update once results are received.

## 2023-03-02 NOTE — Telephone Encounter (Signed)
-----   Message from Ihor Austin sent at 03/02/2023  7:45 AM EDT ----- Regarding: RE: Botox approval Okay to place on my schedule when botox approved. Thank you! ----- Message ----- From: Anson Fret, MD Sent: 03/01/2023   5:18 PM EDT To: Ihor Austin, NP; Yancey Flemings, NT; # Subject: Botox approval                                 Please start botox approval for chronic migraines: G43.711  Meds tried > 2 months: tylenol, magnesium, zofran, prednisone, phenergan, amitriptyline(caused sedation), topiramate(cognitive problems and tingling), gabapentin, BP medications are contraindicated due to hypotension as she usually runs systolic about 100 so cannot take propranolol or other BP meds. Sumatriptan(didn't help). Rizatriptan. Aimovig contraindicated because of constipation.; Allergic reaction with ajovy(body rash), would hesitate to try any other cgrp injection due to risk of anaphylaxis  Her migraines consist of light sensitivity. Sound sensitivity. Photo/phonophobia. +nausea, pulsating/pounding/throbbing. Can be unilateral behind the eye, moderate to severe and put her in bed, under covers in a dark room, no vomiting, can be moderate to severe. Daily headaches and > 10 moderte to severe migraine days a month for >> 3 years. No aura. No medication overuse..  > 10 moderate/severe migraines a month last 12-24 hours. ongoing at this frequency and severity for 3 years and worsening.

## 2023-03-06 ENCOUNTER — Telehealth: Payer: Self-pay | Admitting: *Deleted

## 2023-03-06 ENCOUNTER — Ambulatory Visit (INDEPENDENT_AMBULATORY_CARE_PROVIDER_SITE_OTHER): Payer: BC Managed Care – PPO

## 2023-03-06 DIAGNOSIS — K2950 Unspecified chronic gastritis without bleeding: Secondary | ICD-10-CM

## 2023-03-06 LAB — FECAL OCCULT BLOOD, IMMUNOCHEMICAL: Fecal Occult Bld: POSITIVE — AB

## 2023-03-06 NOTE — Telephone Encounter (Signed)
CRITICAL VALUE STICKER  CRITICAL VALUE: +IFOB  RECEIVER (on-site recipient of call): Silvestre Moment, CMA  DATE & TIME NOTIFIED: 03/06/23 @ 4pm  MESSENGER (representative from lab): Jacki Cones  MD NOTIFIED: Dr. Darrick Huntsman via secure chat  TIME OF NOTIFICATION: 4:03pm  RESPONSE:

## 2023-03-06 NOTE — Telephone Encounter (Signed)
Lvm for pt to call back about results

## 2023-03-06 NOTE — Telephone Encounter (Signed)
Completed auth form and placed in nurse pod for MD signature.

## 2023-03-07 NOTE — Telephone Encounter (Signed)
noted 

## 2023-03-07 NOTE — Telephone Encounter (Signed)
Spoke with pt, pt stated she would call gastroenterology to see if she can bee seen sooner. Pt stated she would reach back out if gastroenterology needed more information.

## 2023-03-07 NOTE — Telephone Encounter (Signed)
Left pt another voicemail

## 2023-03-08 ENCOUNTER — Telehealth: Payer: Self-pay | Admitting: Physician Assistant

## 2023-03-08 NOTE — Telephone Encounter (Signed)
Auth signed by Dr Lucia Gaskins. I faxed it to [facility] along with office note. Received a receipt of confirmation.

## 2023-03-08 NOTE — Telephone Encounter (Signed)
Patient called in needing an urgent appointment due to her results.

## 2023-03-10 ENCOUNTER — Telehealth: Payer: Self-pay | Admitting: Physician Assistant

## 2023-03-10 NOTE — Telephone Encounter (Signed)
Called the patient to confirm her appointment and co pay for Monday 03/13/23 with Inetta Fermo. Patient said she will be here.

## 2023-03-13 ENCOUNTER — Encounter: Payer: Self-pay | Admitting: Physician Assistant

## 2023-03-13 ENCOUNTER — Ambulatory Visit: Payer: BC Managed Care – PPO | Admitting: Physician Assistant

## 2023-03-13 VITALS — BP 105/72 | HR 77 | Temp 98.3°F | Ht 64.0 in | Wt 168.0 lb

## 2023-03-13 DIAGNOSIS — K649 Unspecified hemorrhoids: Secondary | ICD-10-CM | POA: Diagnosis not present

## 2023-03-13 DIAGNOSIS — R195 Other fecal abnormalities: Secondary | ICD-10-CM

## 2023-03-13 DIAGNOSIS — K5904 Chronic idiopathic constipation: Secondary | ICD-10-CM | POA: Diagnosis not present

## 2023-03-13 DIAGNOSIS — R1013 Epigastric pain: Secondary | ICD-10-CM

## 2023-03-13 MED ORDER — HYDROCORTISONE (PERIANAL) 2.5 % EX CREA
1.0000 | TOPICAL_CREAM | Freq: Two times a day (BID) | CUTANEOUS | 1 refills | Status: DC
Start: 2023-03-13 — End: 2023-09-13

## 2023-03-13 NOTE — Progress Notes (Cosign Needed)
Celso Amy, PA-C [address]  [address]  [place], [place] [address]  Main: [phone]  Fax: [phone]   Gastroenterology Consultation  Referring Provider:     [name], MD Primary Care Physician:  [name], MD Primary Gastroenterologist:  Celso Amy, PA-C / Dr. Lannette Donath   Reason for Consultation:     Positive FOBT        HPI:   Sheri Guerrero is a 52 y.o. y/o female referred for consultation & management  by [name], MD.    She is referred for "stomach issues" for many years.  She has a remote history of gastric ulcer.  Never had a follow up EGD.    Gets full quickly,  then feels like her stomach "contracts"  about 30 minutes after eating.  Has been PPI dependent . Felt better on pantoprazole>>omeprazole.  She denies nausea, vomiting, hematemesis, or melena.  Is currently taking Prilosec 20 Mg daily.   No recent EGD.    She has history of chronic constipation for many years.  Constipation has improved on magnesium for the past 7 years.  She takes a smoothie with magnesium.  She had a flareup of hemorrhoids recently and saw some bright red blood on the tissue after bowel movements.  Completed Hemoccult cards which were positive for blood.  She was told to follow-up with GI for positive Hemoccult test.  Colonoscopy done by Dr. Allegra Lai 03/2021 was normal.  No polyps.  No mention of hemorrhoids.  Labs 02/17/2023 showed normal CBC with hemoglobin 14.4.  Serum H. pylori test was negative.  Normal CMP.  Past Medical History:  Diagnosis Date   Abnormal weight gain    Anemia    Anxiety    Depression     Past Surgical History:  Procedure Laterality Date   ABLATION     uterine; approx 2017-2018   COLONOSCOPY WITH PROPOFOL N/A 04/01/2021   Procedure: COLONOSCOPY WITH PROPOFOL;  Surgeon: Toney Reil, MD;  Location: [facility];  Service: Endoscopy;  Laterality: N/A;   KNEE SURGERY Right    age 50   TONSILLECTOMY AND ADENOIDECTOMY   age 60    Prior to Admission medications   Medication Sig Start Date End Date Taking? Authorizing Provider  cyanocobalamin (VITAMIN B12) 1000 MCG/ML injection Inject 1 ml intramuscularly once weekly. 02/21/23   [name], MD  fluticasone (FLONASE) 50 MCG/ACT nasal spray Place 2 sprays into both nostrils daily. 09/28/22   Dana Allan, MD  Fremanezumab-vfrm (AJOVY) 225 MG/1.5ML SOAJ Inject 225 mg into the skin every 30 (thirty) days. 02/24/23   Anson Fret, MD  Magnesium-Potassium-Pyridox [phone] MG CAPS Take 1 tablet by mouth daily.    [provider]  Multiple Vitamin (MULTIVITAMIN) tablet Take 1 tablet by mouth daily.    [provider]  ondansetron (ZOFRAN-ODT) 4 MG disintegrating tablet Take 1-2 tablets (4-8 mg total) by mouth every 8 (eight) hours as needed. 02/24/23   Anson Fret, MD  pantoprazole (PROTONIX) 40 MG tablet Take 1 tablet (40 mg total) by mouth daily. 02/17/23   [name], MD  rizatriptan (MAXALT-MLT) 10 MG disintegrating tablet Take 1 tablet (10 mg total) by mouth as needed for migraine. May repeat in 2 hours if needed 02/24/23   Anson Fret, MD  saccharomyces boulardii (FLORASTOR) 250 MG capsule Take 1 capsule (250 mg total) by mouth daily. 09/28/22   Dana Allan, MD  Semaglutide, 2 MG/DOSE, (OZEMPIC, 2  MG/DOSE,) 8 MG/3ML SOPN INJECT 2MG  ONCE A WEEK AS  DIRECTED 02/17/23   [name], MD  Syringe/Needle, Disp, (SYRINGE 3CC/25GX1") 25G X 1" 3 ML MISC Use for b12 injections 02/17/23   [name], MD  valACYclovir (VALTREX) 1000 MG tablet Take 2 tablets twice a day as needed for cold sore 09/28/22   Dana Allan, MD    Family History  Problem Relation Age of Onset   Cancer Mother        had cervical CA, melanoma, then Stage 4 adeno CA of the lung   Stroke Father    Liver cancer Father    Cancer Paternal Grandmother        breat     Social History   Tobacco Use   Smoking status: Never   Smokeless tobacco: Never   Vaping Use   Vaping status: Never Used  Substance Use Topics   Alcohol use: Yes    Alcohol/week: 1.0 standard drink of alcohol    Types: 1 Standard drinks or equivalent per week    Comment: occassional, one a week   Drug use: No    Allergies as of 03/13/2023   (No Known Allergies)    Review of Systems:    All systems reviewed and negative except where noted in HPI.   Physical Exam:  BP 105/72   Pulse 77   Temp 98.3 F (36.8 C)   Ht 5\' 4"  (1.626 m)   Wt 168 lb (76.2 kg)   BMI 28.84 kg/m  No LMP recorded. Patient has had an ablation. Psych:  Alert and cooperative. Normal mood and affect. General:   Alert,  Well-developed, well-nourished, pleasant and cooperative in NAD Head:  Normocephalic and atraumatic. Eyes:  Sclera clear, no icterus.   Conjunctiva pink. Neck:  Supple; no masses or thyromegaly. Lungs:  Respirations even and unlabored.  Clear throughout to auscultation.   No wheezes, crackles, or rhonchi. No acute distress. Heart:  Regular rate and rhythm; no murmurs, clicks, rubs, or gallops. Abdomen:  Normal bowel sounds.  No bruits.  Soft, and non-distended without masses, hepatosplenomegaly or hernias noted.  No Tenderness.  No guarding or rebound tenderness.    Neurologic:  Alert and oriented x3;  grossly normal neurologically. Psych:  Alert and cooperative. Normal mood and affect.  Imaging Studies: No results found.  Assessment and Plan:   Sheri Guerrero is a 52 y.o. y/o female has been referred for positive Hemoccult test.  She has chronic constipation and history of external hemorrhoids.  Has also been having some epigastric pain with remote history of stomach ulcer many years ago.  I am scheduling EGD and colonoscopy for further evaluation.  Recent CBC showed normal hemoglobin 14 g.  No anemia.  1.  Positive Hemoccult test  Scheduling Colonoscopy I discussed risks of colonoscopy with patient to include risk of bleeding, colon perforation, and risk of  sedation.  Patient expressed understanding and agrees to proceed with colonoscopy.   2 Day Prep.  2.  Epigastric pain / Remote history of peptic ulcer  Scheduling EGD I discussed risks of EGD with patient to include risk of bleeding, perforation, and risk of sedation.  Patient expressed understanding and agrees to proceed with EGD.   3.  Chronic constipation  Continue magnesium daily.  High-fiber diet and drink 64 ounces of water daily.  Recommend OTC MiraLAX if needed.  I offered prescription Linzess, Amitiza, or Trulance and patient declined.  4.  External hemorrhoids  Discussed treatment  for hemorrhoids at length. Rx hydrocortisone 2.5% cream apply 2-3 times daily if needed for flareup. Discussed referral for Surgery as a last resort if Conservative treatment fails. Discussed Internal Hemorrhoid Banding if no improvement with conservative treament.   Follow up 4 weeks after EGD and colonoscopy with TG  Celso Amy, PA-C

## 2023-03-14 ENCOUNTER — Telehealth: Payer: Self-pay

## 2023-03-14 NOTE — Telephone Encounter (Signed)
Spoke with Tr[facility] (Endo unit) and colonoscopy has been cancelled -EGD still current for 9/25/-Patient notified.

## 2023-03-20 MED ORDER — ONABOTULINUMTOXINA 200 UNITS IJ SOLR
200.0000 [IU] | INTRAMUSCULAR | 1 refills | Status: DC
Start: 2023-03-20 — End: 2023-09-12

## 2023-03-20 NOTE — Addendum Note (Signed)
Addended by: Jacqualine Code D on: 03/20/2023 01:31 PM   Modules accepted: Orders

## 2023-03-20 NOTE — Telephone Encounter (Signed)
I called [employer] to check on status, hold time was an hour. I was able to pull up auth from [employer]. Please send rx to [facility].  Approval Reference # [id number] (03/08/23-08/23/23)

## 2023-03-20 NOTE — Telephone Encounter (Signed)
Rx sent 

## 2023-03-21 NOTE — Telephone Encounter (Signed)
Called pt and got her scheduled for first Botox with Shanda Bumps on 9/19 @ 12:45 pm. I informed her that [facility] would be calling her at some point to set up her account. She did not have any questions at this time.

## 2023-04-03 ENCOUNTER — Telehealth: Payer: Self-pay | Admitting: Neurology

## 2023-04-03 NOTE — Telephone Encounter (Signed)
Noted  

## 2023-04-03 NOTE — Telephone Encounter (Signed)
Bea @ [facility] called to confirm office info, she stated it will ship tonight for delivery on Wed. 200 units quantity 1 90 day supply  This is FYI for POD 4

## 2023-04-12 ENCOUNTER — Ambulatory Visit: Payer: BC Managed Care – PPO | Admitting: Adult Health

## 2023-04-12 ENCOUNTER — Encounter: Payer: Self-pay | Admitting: Adult Health

## 2023-04-12 DIAGNOSIS — G43711 Chronic migraine without aura, intractable, with status migrainosus: Secondary | ICD-10-CM | POA: Diagnosis not present

## 2023-04-12 MED ORDER — UBRELVY 100 MG PO TABS: 100.0000 mg | ORAL_TABLET | ORAL | 0 refills | Status: DC | PRN

## 2023-04-12 MED ORDER — ONABOTULINUMTOXINA 200 UNITS IJ SOLR
155.0000 [IU] | Freq: Once | INTRAMUSCULAR | Status: AC
Start: 2023-04-12 — End: 2023-04-12
  Administered 2023-04-12: 165 [IU] via INTRAMUSCULAR

## 2023-04-12 NOTE — Progress Notes (Signed)
Botox- 200 units x 1 vial Lot: [id number] Expiration: 07/2025 NDC: 3710-6269-48  Bacteriostatic 0.9% Sodium Chloride- 4 mL  Lot: [id number] Expiration: 05/06/2023 NDC: 3500-9381-82  Dx: X93.716 S/P ([facility]) Witnessed by Berna Spare, CMA

## 2023-04-12 NOTE — Patient Instructions (Addendum)
Your Plan:  Try Bernita Raisin to take at onset of migraine - can repeat after 2 hours if needed - if helpful, please let me know and I will place an order for this   If no benefit, please let me know and we can try Nurtec  Repeat botox in 3 months       Thank you for coming to see [place] at [facility]. I hope we have been able to provide you high quality care today.  You may receive a patient satisfaction survey over the next few weeks. We would appreciate your feedback and comments so that we may continue to improve ourselves and the health of our patients.

## 2023-04-12 NOTE — Progress Notes (Signed)
Update 04/12/2023 Sheri Guerrero: Patient is being seen for initial Botox injection.  Reports persistent migraine headaches, worse since prior visit, now experiencing almost daily. She will clench when migraines start which worsens her migraines. Also has neck pain, does dry needling. Use of rizatriptan with limited benefit and causes drowsiness.  She had previously tried sumatriptan.  Provided samples for Ubrelvy, will call if beneficial for prescription, if not beneficial can try Nurtec.  Tolerated injection well today, will repeat in 3 months.       Consent Form Botulism Toxin Injection For Chronic Migraine    Reviewed orally with patient, additionally signature is on file:  Botulism toxin has been approved by the Federal drug administration for treatment of chronic migraine. Botulism toxin does not cure chronic migraine and it may not be effective in some patients.  The administration of botulism toxin is accomplished by injecting a small amount of toxin into the muscles of the neck and head. Dosage must be titrated for each individual. Any benefits resulting from botulism toxin tend to wear off after 3 months with a repeat injection required if benefit is to be maintained. Injections are usually done every 3-4 months with maximum effect peak achieved by about 2 or 3 weeks. Botulism toxin is expensive and you should be sure of what costs you will incur resulting from the injection.  The side effects of botulism toxin use for chronic migraine may include:   -Transient, and usually mild, facial weakness with facial injections  -Transient, and usually mild, head or neck weakness with head/neck injections  -Reduction or loss of forehead facial animation due to forehead muscle weakness  -Eyelid drooping  -Dry eye  -Pain at the site of injection or bruising at the site of injection  -Double vision  -Potential unknown long term risks   Contraindications: You should not have Botox if you are  pregnant, nursing, allergic to albumin, have an infection, skin condition, or muscle weakness at the site of the injection, or have myasthenia gravis, Lambert-Eaton syndrome, or ALS.  It is also possible that as with any injection, there may be an allergic reaction or no effect from the medication. Reduced effectiveness after repeated injections is sometimes seen and rarely infection at the injection site may occur. All care will be taken to prevent these side effects. If therapy is given over a long time, atrophy and wasting in the muscle injected may occur. Occasionally the patient's become refractory to treatment because they develop antibodies to the toxin. In this event, therapy needs to be modified.  I have read the above information and consent to the administration of botulism toxin.    BOTOX PROCEDURE NOTE FOR MIGRAINE HEADACHE  Contraindications and precautions discussed with patient(above). Aseptic procedure was observed and patient tolerated procedure. Procedure performed by Ihor Austin, AGNP-BC.   The condition has existed for more than 6 months, and pt does not have a diagnosis of ALS, Myasthenia Gravis or Lambert-Eaton Syndrome.  Risks and benefits of injections discussed and pt agrees to proceed with the procedure.  Written consent obtained  These injections are medically necessary. Pt  receives good benefits from these injections. These injections do not cause sedations or hallucinations which the oral therapies may cause.   Description of procedure:  The patient was placed in a sitting position. The standard protocol was used for Botox as follows, with 5 units of Botox injected at each site:  -Procerus muscle, midline injection  -Corrugator muscle, bilateral injection  -  Frontalis muscle, bilateral injection, with 2 sites each side, medial injection was performed in the upper one third of the frontalis muscle, in the region vertical from the medial inferior edge of the  superior orbital rim. The lateral injection was again in the upper one third of the forehead vertically above the lateral limbus of the cornea, 1.5 cm lateral to the medial injection site.  -Temporalis muscle injection, 4 sites, bilaterally. The first injection was 3 cm above the tragus of the ear, second injection site was 1.5 cm to 3 cm up from the first injection site in line with the tragus of the ear. The third injection site was 1.5-3 cm forward between the first 2 injection sites. The fourth injection site was 1.5 cm posterior to the second injection site. 5th site laterally in the temporalis  muscleat the level of the outer canthus.  -Occipitalis muscle injection, 3 sites, bilaterally. The first injection was done one half way between the occipital protuberance and the tip of the mastoid process behind the ear. The second injection site was done lateral and superior to the first, 1 fingerbreadth from the first injection. The third injection site was 1 fingerbreadth superiorly and medially from the first injection site.  -Cervical paraspinal muscle injection, 2 sites, bilaterally. The first injection site was 1 cm from the midline of the cervical spine, 3 cm inferior to the lower border of the occipital protuberance. The second injection site was 1.5 cm superiorly and laterally to the first injection site.  -Trapezius muscle injection was performed at 3 sites, bilaterally. The first injection site was in the upper trapezius muscle halfway between the inflection point of the neck, and the acromion. The second injection site was one half way between the acromion and the first injection site. The third injection was done between the first injection site and the inflection point of the neck.  -Masseter muscle injection performed at 1 site bilaterally.     A total of 200 units of Botox was prepared, 165 units of Botox was injected as documented above, any Botox not injected was wasted. The patient  tolerated the procedure well, there were no complications of the above procedure.   Ihor Austin, AGNP-BC  [facility] [address] [place], [place] [address]  Phone [phone] Fax [phone] Note: This document was prepared with digital dictation and possible smart phrase technology. Any transcriptional errors that result from this process are unintentional.

## 2023-04-21 ENCOUNTER — Encounter: Payer: Self-pay | Admitting: Internal Medicine

## 2023-04-21 ENCOUNTER — Ambulatory Visit: Payer: BC Managed Care – PPO | Admitting: Physician Assistant

## 2023-04-22 ENCOUNTER — Other Ambulatory Visit: Payer: Self-pay | Admitting: Internal Medicine

## 2023-04-22 DIAGNOSIS — K219 Gastro-esophageal reflux disease without esophagitis: Secondary | ICD-10-CM

## 2023-04-23 ENCOUNTER — Encounter: Payer: Self-pay | Admitting: Internal Medicine

## 2023-04-24 ENCOUNTER — Telehealth: Payer: Self-pay | Admitting: Physician Assistant

## 2023-04-24 NOTE — Telephone Encounter (Signed)
Patient called in because she has questions about her procedure.

## 2023-04-27 ENCOUNTER — Ambulatory Visit: Payer: BC Managed Care – PPO | Admitting: Adult Health

## 2023-04-29 ENCOUNTER — Other Ambulatory Visit: Payer: Self-pay | Admitting: Internal Medicine

## 2023-05-01 ENCOUNTER — Telehealth: Payer: Self-pay

## 2023-05-01 NOTE — Telephone Encounter (Signed)
Spoke with [facility] endoscopy unit- received message from patient to cancel procedure scheduled for 05-03-23 with Dr.Vanga at this time-left message  with patient to let her know it has been cancelled.

## 2023-05-02 ENCOUNTER — Other Ambulatory Visit: Payer: Self-pay | Admitting: Internal Medicine

## 2023-05-03 ENCOUNTER — Encounter: Admission: RE | Payer: Self-pay | Source: Home / Self Care

## 2023-05-03 ENCOUNTER — Ambulatory Visit
Admission: RE | Admit: 2023-05-03 | Payer: BC Managed Care – PPO | Source: Home / Self Care | Admitting: Gastroenterology

## 2023-05-03 SURGERY — ESOPHAGOGASTRODUODENOSCOPY (EGD) WITH PROPOFOL
Anesthesia: General

## 2023-05-03 MED ORDER — OZEMPIC (2 MG/DOSE) 8 MG/3ML ~~LOC~~ SOPN: PEN_INJECTOR | SUBCUTANEOUS | 0 refills | Status: DC

## 2023-05-11 ENCOUNTER — Other Ambulatory Visit (HOSPITAL_COMMUNITY): Payer: Self-pay

## 2023-05-11 ENCOUNTER — Telehealth: Payer: Self-pay | Admitting: Pharmacy Technician

## 2023-05-11 NOTE — Telephone Encounter (Signed)
Pharmacy Patient Advocate Encounter   Received notification from CoverMyMeds that prior authorization for Ozempic (2 MG/DOSE) 8MG /3ML pen-injectors is required/requested.   Insurance verification completed.   The patient is insured through [facility] .   Per test claim: PA required; PA submitted to [facility] via CoverMyMeds Key/confirmation #/EOC [id number] Status is pending

## 2023-05-16 ENCOUNTER — Encounter: Payer: Self-pay | Admitting: Internal Medicine

## 2023-05-16 ENCOUNTER — Other Ambulatory Visit: Payer: Self-pay | Admitting: Internal Medicine

## 2023-05-16 NOTE — Telephone Encounter (Signed)
I do not see where the PA was denied I just a note stating that the PA was completed on 05/11/2023. Would like to follow up on the PA.

## 2023-05-16 NOTE — Telephone Encounter (Signed)
Pharmacy Patient Advocate Encounter  Received notification from [facility] that Prior Authorization for [facility] has been DENIED.  Full denial letter will be uploaded to the media tab. See denial reason below.   PA #/Case ID/Reference #: [id number]    DENIAL REASON: Your plan only covers this drug when A) your A1C is sent to [place], and your test results are in a certain range (A1C greater than or equal to 6.5 percent), B) your 2-hour plasma glucose (PG) during oral glucose tolerance test (OGTT) is sent to [place], and your results are in a certain range (2-hour PG greater than or equal to 200 mg/dL), C) your random plasma glucose is sent to [place], and your test results are in a certain range (random plasma glucose greater than or equal to 200 mg/dL with symptoms of hyperglycemia (e.g., polyuria, polydipsia, polyphagia) or hyperglycemic crisis), or D) your fasting plasma glucose (FPG) is sent to [place], and your results are in a certain range (FPG greater than or equal to 126 mg/dL).

## 2023-05-17 ENCOUNTER — Other Ambulatory Visit: Payer: Self-pay | Admitting: Internal Medicine

## 2023-05-17 MED ORDER — TIRZEPATIDE-WEIGHT MANAGEMENT 10 MG/0.5ML ~~LOC~~ SOAJ: 10.0000 mg | SUBCUTANEOUS | 2 refills | Status: DC

## 2023-05-17 NOTE — Telephone Encounter (Signed)
PA for Zepbound is needed.  

## 2023-05-18 ENCOUNTER — Other Ambulatory Visit (HOSPITAL_COMMUNITY): Payer: Self-pay

## 2023-05-19 ENCOUNTER — Other Ambulatory Visit (HOSPITAL_COMMUNITY): Payer: Self-pay

## 2023-05-19 NOTE — Telephone Encounter (Signed)
noted 

## 2023-05-19 NOTE — Telephone Encounter (Signed)
Per test claim, [facility], no PA required:

## 2023-05-19 NOTE — Telephone Encounter (Signed)
PA is not needed for the Zepbound but her insurance will not cover any of the cost so the pt's out of pocket cost will be $1,345.44.

## 2023-05-30 NOTE — Telephone Encounter (Signed)
Pt gave a verbal understanding and stated that she would call back to schedule.

## 2023-05-30 NOTE — Telephone Encounter (Signed)
Spoke with Sheri Guerrero. Informed Sheri Guerrero that zepbound was covered just has a high cost amount. Sheri Guerrero also informed that ozempic was denied. Informed Sheri Guerrero she could try a savings cards for zepbound to see if that will help.

## 2023-05-31 ENCOUNTER — Encounter: Payer: Self-pay | Admitting: Internal Medicine

## 2023-05-31 ENCOUNTER — Telehealth: Payer: BC Managed Care – PPO | Admitting: Internal Medicine

## 2023-05-31 VITALS — BP 108/70 | Ht 64.0 in | Wt 156.4 lb

## 2023-05-31 DIAGNOSIS — E66811 Obesity, class 1: Secondary | ICD-10-CM

## 2023-05-31 NOTE — Progress Notes (Unsigned)
Virtual Visit via Caregility   Note   This format is felt to be most appropriate for this patient at this time.  All issues noted in this document were discussed and addressed.  No physical exam was performed (except for noted visual exam findings with Video Visits).   I connected with Sheri Guerrero  on 05/31/23 at  3:30 PM EDT by a video enabled telemedicine application or telephone and verified that I am speaking with the correct person using two identifiers. Location patient: home Location provider: work or home office Persons participating in the virtual visit: patient, provider  I discussed the limitations, risks, security and privacy concerns of performing an evaluation and management service by telephone and the availability of in person appointments. I also discussed with the patient that there may be a patient responsible charge related to this service. The patient expressed understanding and agreed to proceed.  Interactive audio and video telecommunications were attempted between this provider and patient, however failed, due to patient having technical difficulties OR patient did not have access to video capability.  We continued and completed visit with audio only. ***  Reason for visit: weight management   HPI:  52 yr old female with history of    ROS: See pertinent positives and negatives per HPI.  Past Medical History:  Diagnosis Date   Abnormal weight gain    Anemia    Anxiety    Depression     Past Surgical History:  Procedure Laterality Date   ABLATION     uterine; approx 2017-2018   COLONOSCOPY WITH PROPOFOL N/A 04/01/2021   Procedure: COLONOSCOPY WITH PROPOFOL;  Surgeon: Toney Reil, MD;  Location: [facility];  Service: Endoscopy;  Laterality: N/A;   KNEE SURGERY Right    age 49   TONSILLECTOMY AND ADENOIDECTOMY  age 23    Family History  Problem Relation Age of Onset   Cancer Mother        had cervical CA, melanoma, then Stage 4 adeno CA of  the lung   Stroke Father    Liver cancer Father    Cancer Paternal Grandmother        breat    SOCIAL HX: ***   Current Outpatient Medications:    botulinum toxin Type A (BOTOX) 200 units injection, Inject 200 Units into the muscle every 3 (three) months., Disp: 1 each, Rfl: 1   cyanocobalamin (VITAMIN B12) 1000 MCG/ML injection, Inject 1 ml intramuscularly once weekly., Disp: 13 mL, Rfl: 3   fluticasone (FLONASE) 50 MCG/ACT nasal spray, Place 2 sprays into both nostrils daily., Disp: 16 g, Rfl: 6   Magnesium-Potassium-Pyridox 100-99-200 MG CAPS, Take 1 tablet by mouth daily., Disp: , Rfl:    Multiple Vitamin (MULTIVITAMIN) tablet, Take 1 tablet by mouth daily., Disp: , Rfl:    pantoprazole (PROTONIX) 40 MG tablet, Take 40 mg by mouth daily., Disp: , Rfl:    Syringe/Needle, Disp, (SYRINGE 3CC/25GX1") 25G X 1" 3 ML MISC, Use for b12 injections, Disp: 50 each, Rfl: 0   valACYclovir (VALTREX) 1000 MG tablet, Take 2 tablets twice a day as needed for cold sore, Disp: 20 tablet, Rfl: 3   hydrocortisone (ANUSOL-HC) 2.5 % rectal cream, Place 1 Application rectally 2 (two) times daily. (Patient not taking: Reported on 05/31/2023), Disp: 30 g, Rfl: 1   ondansetron (ZOFRAN-ODT) 4 MG disintegrating tablet, Take 1-2 tablets (4-8 mg total) by mouth every 8 (eight) hours as needed. (Patient not taking: Reported on 05/31/2023), Disp: 30 tablet,  Rfl: 3  Current Facility-Administered Medications:    Fremanezumab-vfrm SOSY 225 mg, 225 mg, Subcutaneous, Once,   EXAM:  VITALS per patient if applicable:  GENERAL: alert, oriented, appears well and in no acute distress  HEENT: atraumatic, conjunttiva clear, no obvious abnormalities on inspection of external nose and ears  NECK: normal movements of the head and neck  LUNGS: on inspection no signs of respiratory distress, breathing rate appears normal, no obvious gross SOB, gasping or wheezing  CV: no obvious cyanosis  MS: moves all visible extremities  without noticeable abnormality  PSYCH/NEURO: pleasant and cooperative, no obvious depression or anxiety, speech and thought processing grossly intact  ASSESSMENT AND PLAN: There are no diagnoses linked to this encounter.    I discussed the assessment and treatment plan with the patient. The patient was provided an opportunity to ask questions and all were answered. The patient agreed with the plan and demonstrated an understanding of the instructions.   The patient was advised to call back or seek an in-person evaluation if the symptoms worsen or if the condition fails to improve as anticipated.   I spent 30 minutes dedicated to the care of this patient on the date of this encounter to include pre-visit review of his medical history,  Face-to-face time with the patient , and post visit ordering of testing and therapeutics.    [name], MD

## 2023-05-31 NOTE — Assessment & Plan Note (Signed)
She has lost 46 lbs using Ozempic and has reached her desired weight of 168 lbs.  She is exercising regularly vigourously,  including kickboxing once a week. She is interested in maintaining her current weight  using 1 mg weekly,  and eventually weaning herself off over the next year,  but her insurance will no longer cover the medications.  Will send rx to [facility] for compounded semiglutide 2 mg

## 2023-05-31 NOTE — Patient Instructions (Signed)
Here are some local and online Sources for zepbound and [phone] (direct pay)   [facility] CASH PAY FOR ZEPBOUND VIAL [place], [place] - [address]   Isaac Bliss  Drug store in [place] makes a compounded version of semiglutide    [facility] (online  GLP)  [facility]:  (Online GLP )  Delrae Rend MD in [place] also provides medication through a weight management program    Thr first 2 are  the ones I trust the most   Let me know where you want an rx sent

## 2023-06-01 MED ORDER — SEMAGLUTIDE (2 MG/DOSE) 8 MG/3ML ~~LOC~~ SOPN: 2.0000 mg | PEN_INJECTOR | SUBCUTANEOUS | 2 refills | Status: DC

## 2023-06-14 ENCOUNTER — Ambulatory Visit: Payer: BC Managed Care – PPO | Admitting: Physician Assistant

## 2023-07-05 ENCOUNTER — Ambulatory Visit: Payer: BC Managed Care – PPO | Admitting: Adult Health

## 2023-07-05 DIAGNOSIS — G43711 Chronic migraine without aura, intractable, with status migrainosus: Secondary | ICD-10-CM

## 2023-07-05 MED ORDER — ONABOTULINUMTOXINA 200 UNITS IJ SOLR
155.0000 [IU] | Freq: Once | INTRAMUSCULAR | Status: AC
Start: 2023-07-05 — End: 2023-07-05
  Administered 2023-07-05: 155 [IU] via INTRAMUSCULAR

## 2023-07-05 NOTE — Progress Notes (Signed)
returns for repeat Botox, prior injection 04/12/2023.  Reports significant improvement of migraine headaches since prior visit without any significant headaches since initial injection. Notes improvement of jaw pain that previously contributed to migraines.  Previously provided Ubrelvy samples for rescue but has not needed. Has noticed some increased frequency of migraines and cervicalgia over the past week.  Tolerated procedure well today. Will return in 3 months for repeat injections.         Consent Form Botulism Toxin Injection For Chronic Migraine    Reviewed orally with patient, additionally signature is on file:  Botulism toxin has been approved by the Federal drug administration for treatment of chronic migraine. Botulism toxin does not cure chronic migraine and it may not be effective in some patients.  The administration of botulism toxin is accomplished by injecting a small amount of toxin into the muscles of the neck and head. Dosage must be titrated for each individual. Any benefits resulting from botulism toxin tend to wear off after 3 months with a repeat injection required if benefit is to be maintained. Injections are usually done every 3-4 months with maximum effect peak achieved by about 2 or 3 weeks. Botulism toxin is expensive and you should be sure of what costs you will incur resulting from the injection.  The side effects of botulism toxin use for chronic migraine may include:   -Transient, and usually mild, facial weakness with facial injections  -Transient, and usually mild, head or neck weakness with head/neck injections  -Reduction or loss of forehead facial animation due to forehead muscle weakness  -Eyelid drooping  -Dry eye  -Pain at the site of injection or bruising at the site of injection  -Double vision  -Potential unknown long term risks   Contraindications: You should not have Botox if you are pregnant, nursing, allergic to albumin, have an  infection, skin condition, or muscle weakness at the site of the injection, or have myasthenia gravis, Lambert-Eaton syndrome, or ALS.  It is also possible that as with any injection, there may be an allergic reaction or no effect from the medication. Reduced effectiveness after repeated injections is sometimes seen and rarely infection at the injection site may occur. All care will be taken to prevent these side effects. If therapy is given over a long time, atrophy and wasting in the muscle injected may occur. Occasionally the patient's become refractory to treatment because they develop antibodies to the toxin. In this event, therapy needs to be modified.  I have read the above information and consent to the administration of botulism toxin.    BOTOX PROCEDURE NOTE FOR MIGRAINE HEADACHE  Contraindications and precautions discussed with patient(above). Aseptic procedure was observed and patient tolerated procedure. Procedure performed by Ihor Austin, AGNP-BC.   The condition has existed for more than 6 months, and pt does not have a diagnosis of ALS, Myasthenia Gravis or Lambert-Eaton Syndrome.  Risks and benefits of injections discussed and pt agrees to proceed with the procedure.  Written consent obtained  These injections are medically necessary. Pt  receives good benefits from these injections. These injections do not cause sedations or hallucinations which the oral therapies may cause.   Description of procedure:  The patient was placed in a sitting position. The standard protocol was used for Botox as follows, with 5 units of Botox injected at each site:  -Procerus muscle, midline injection  -Corrugator muscle, bilateral injection  -Frontalis muscle, bilateral injection, with 2 sites each side, medial  injection was performed in the upper one third of the frontalis muscle, in the region vertical from the medial inferior edge of the superior orbital rim. The lateral injection was  again in the upper one third of the forehead vertically above the lateral limbus of the cornea, 1.5 cm lateral to the medial injection site.  -Temporalis muscle injection, 4 sites, bilaterally. The first injection was 3 cm above the tragus of the ear, second injection site was 1.5 cm to 3 cm up from the first injection site in line with the tragus of the ear. The third injection site was 1.5-3 cm forward between the first 2 injection sites. The fourth injection site was 1.5 cm posterior to the second injection site. 5th site laterally in the temporalis  muscleat the level of the outer canthus.  -Occipitalis muscle injection, 3 sites, bilaterally. The first injection was done one half way between the occipital protuberance and the tip of the mastoid process behind the ear. The second injection site was done lateral and superior to the first, 1 fingerbreadth from the first injection. The third injection site was 1 fingerbreadth superiorly and medially from the first injection site.  -Cervical paraspinal muscle injection, 2 sites, bilaterally. The first injection site was 1 cm from the midline of the cervical spine, 3 cm inferior to the lower border of the occipital protuberance. The second injection site was 1.5 cm superiorly and laterally to the first injection site.  -Trapezius muscle injection was performed at 3 sites, bilaterally. The first injection site was in the upper trapezius muscle halfway between the inflection point of the neck, and the acromion. The second injection site was one half way between the acromion and the first injection site. The third injection was done between the first injection site and the inflection point of the neck.  -Masseter muscle injection performed at 1 site bilaterally.      A total of 200 units of Botox was prepared, 165 units of Botox was injected as documented above, any Botox not injected was wasted. The patient tolerated the procedure well, there were no  complications of the above procedure.   Ihor Austin, AGNP-BC  [facility] [address] [place], [place] [address]  Phone [phone] Fax [phone] Note: This document was prepared with digital dictation and possible smart phrase technology. Any transcriptional errors that result from this process are unintentional.

## 2023-07-05 NOTE — Progress Notes (Signed)
Botox- 200 units x 1 vial Lot: [id number] Expiration: 04/2025 NDC: 9147-8295-62  Bacteriostatic 0.9% Sodium Chloride- * mL  Lot: [id number] Expiration: 11/07/2023 NDC: 7846-9629-52  Dx: 43.709 S/P  Witnessed by Randa Evens, CMA

## 2023-07-10 ENCOUNTER — Other Ambulatory Visit: Payer: Self-pay | Admitting: Internal Medicine

## 2023-08-22 ENCOUNTER — Encounter: Payer: Self-pay | Admitting: Internal Medicine

## 2023-08-22 NOTE — Telephone Encounter (Signed)
 Received fax of approval, faxed a copy to [facility] as well.  Auth#: [id number] (08/22/23-08/21/24)

## 2023-08-22 NOTE — Telephone Encounter (Signed)
 Submitted auth request under [employer], status is pending. Key: [id number]

## 2023-08-23 NOTE — Telephone Encounter (Signed)
 PA needed for Ozempic

## 2023-08-29 ENCOUNTER — Other Ambulatory Visit (HOSPITAL_COMMUNITY): Payer: Self-pay

## 2023-09-12 MED ORDER — ONABOTULINUMTOXINA 200 UNITS IJ SOLR: 155.0000 [IU] | INTRAMUSCULAR | 1 refills | Status: DC

## 2023-09-12 NOTE — Addendum Note (Signed)
Addended by: Marlana Latus C on: 09/12/2023 11:30 AM   Modules accepted: Orders

## 2023-09-12 NOTE — Telephone Encounter (Signed)
Refill sent for the patient.  

## 2023-09-12 NOTE — Telephone Encounter (Signed)
 Please send Botox refills to [facility], thank you!

## 2023-09-13 ENCOUNTER — Ambulatory Visit: Payer: 59 | Admitting: Internal Medicine

## 2023-09-13 ENCOUNTER — Ambulatory Visit: Payer: 59

## 2023-09-13 ENCOUNTER — Encounter: Payer: Self-pay | Admitting: Internal Medicine

## 2023-09-13 VITALS — BP 106/64 | HR 71 | Ht 64.0 in | Wt 157.4 lb

## 2023-09-13 DIAGNOSIS — J0101 Acute recurrent maxillary sinusitis: Secondary | ICD-10-CM | POA: Diagnosis not present

## 2023-09-13 DIAGNOSIS — G43501 Persistent migraine aura without cerebral infarction, not intractable, with status migrainosus: Secondary | ICD-10-CM

## 2023-09-13 DIAGNOSIS — J22 Unspecified acute lower respiratory infection: Secondary | ICD-10-CM

## 2023-09-13 DIAGNOSIS — J9801 Acute bronchospasm: Secondary | ICD-10-CM

## 2023-09-13 DIAGNOSIS — G43909 Migraine, unspecified, not intractable, without status migrainosus: Secondary | ICD-10-CM | POA: Insufficient documentation

## 2023-09-13 DIAGNOSIS — B001 Herpesviral vesicular dermatitis: Secondary | ICD-10-CM

## 2023-09-13 MED ORDER — VALACYCLOVIR HCL 1 G PO TABS: ORAL_TABLET | ORAL | 3 refills | Status: AC

## 2023-09-13 MED ORDER — FLUTICASONE PROPIONATE 50 MCG/ACT NA SUSP: 2.0000 | Freq: Every day | NASAL | 6 refills | Status: DC

## 2023-09-13 MED ORDER — BENZONATATE 200 MG PO CAPS: 200.0000 mg | ORAL_CAPSULE | Freq: Three times a day (TID) | ORAL | 1 refills | Status: DC | PRN

## 2023-09-13 MED ORDER — PREDNISONE 10 MG PO TABS: ORAL_TABLET | ORAL | 0 refills | Status: DC

## 2023-09-13 MED ORDER — AMOXICILLIN-POT CLAVULANATE 875-125 MG PO TABS: 1.0000 | ORAL_TABLET | Freq: Two times a day (BID) | ORAL | 0 refills | Status: DC

## 2023-09-13 NOTE — Patient Instructions (Addendum)
 The rapid flu and COVID tests are negative.  The RSV test will take 48 hours   Start the augmentin  tonight;   take with food.   If the chest xray suggests pneumonia I will add a 2nd antibiotic to take along with the augmentin   The cough medication can be combined with Delsy or Robitussin if needed  Nyquil has benadryl in it so avoid using it  Prednisone  taper  has also been sent and can be added if cough persists   Valtrex  and flonase  refilled as well  Use afrin for 5 days max if needed for sinus congestion     Please take a probiotic ( Align, Floraque or Culturelle), the generic version of one of these over the counter medications,Yogurt, for a minimum of 3 weeks  ANY TIME YOU ARE TREATED WITH AN ANTIBIOTIIC to prevent a serious antibiotic associated diarrhea  Called clostridium dificile colitis.  Taking a probiotic may also prevent vaginitis due to yeast infections and can be continued indefinitely if you feel that it improves your digestion or your elimination (bowels).

## 2023-09-13 NOTE — Progress Notes (Addendum)
 Subjective:  Patient ID: Sheri Guerrero, female    DOB: [birth year]  Age: 53 y.o. MRN: [id number]  CC: The primary encounter diagnosis was Lower respiratory infection. 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  Since her symptoms started 4 days ago.  Fever curve has  been improving.  She has noted blood streaked purulent sinus drainage and green sputum.  She notes pain in the left upper back with cough and deep breathing,  similar to how she felt several years ago when she was diagnosed with bILATERAL pnemonia.  She did not have a follow up film to document resolution of infiltrates but she has been asymptomatic since then, is a lifelong non smoker,  and exercises regularly   2) Obesity:  she has  lost 40 lbs using  a GLP 1 agonist for appetite suppression  the compounded semiglutide every 10 days   Outpatient Medications Prior to Visit  Medication Sig Dispense Refill   botulinum toxin Type A  (BOTOX ) 200 units injection Inject 155 Units into the muscle every 3 (three) months. 1 each 1   cyanocobalamin  (VITAMIN B12) 1000 MCG/ML injection Inject 1 ml intramuscularly once weekly. 13 mL 3   Magnesium-Potassium-Pyridox 100-99-200 MG CAPS Take 1 tablet by mouth daily.     Multiple Vitamin (MULTIVITAMIN) tablet Take 1 tablet by mouth daily.     pantoprazole  (PROTONIX ) 40 MG tablet TAKE 1  TABLET DAILY 90 tablet 1   Semaglutide , 2 MG/DOSE, 8 MG/3ML SOPN Inject 2 mg as directed once a week. 3 mL 2   Syringe/Needle, Disp, (SYRINGE 3CC/25GX1") 25G X 1" 3 ML MISC Use for b12 injections 50 each 0   fluticasone  (FLONASE ) 50 MCG/ACT nasal spray Place 2 sprays into both nostrils daily. 16 g 6   hydrocortisone  (ANUSOL -HC) 2.5 % rectal cream Place 1 Application rectally 2 (two) times daily. (Patient not taking: Reported on 09/13/2023) 30 g 1   ondansetron  (ZOFRAN -ODT) 4 MG disintegrating tablet Take 1-2 tablets (4-8 mg total) by mouth every 8 (eight) hours as needed. (Patient not taking: Reported on 09/13/2023) 30 tablet 3   valACYclovir  (VALTREX ) 1000 MG tablet Take 2 tablets twice a day as needed for cold sore 20 tablet 3   Facility-Administered Medications Prior to Visit  Medication Dose Route Frequency Provider Last Rate Last Rate Last Admin   Fremanezumab -vfrm SOSY 225 mg  225 mg Subcutaneous Once         Review of Systems;  Patient denies , unintentional weight loss, skin rash, eye pain, s dysphagia,  hemoptysis , dyspnea, wheezing, , palpitations, orthopnea, edema, abdominal pain, nausea, melena, diarrhea, constipation, flank pain, dysuria, hematuria, urinary  Frequency, nocturia, numbness, tingling, seizures,  Focal weakness, Loss of consciousness,  Tremor, insomnia, depression, anxiety, and suicidal ideation.      Objective:  BP 106/64   Pulse 71   Ht 5\' 4" (1.626 m)  Wt 157 lb 6.4 oz (71.4 kg)   SpO2 99%   BMI 27.02 kg/m   BP Readings from Last 3 Encounters:  09/13/23 106/64  05/31/23 108/70  03/13/23 105/72    Wt Readings from Last 3 Encounters:  09/13/23 157 lb 6.4 oz (71.4 kg)  05/31/23 156 lb 6.4 oz (70.9 kg)  03/13/23 168 lb (76.2 kg)    Physical Exam Vitals reviewed.  Constitutional:      General: She is not in acute distress.    Appearance: Normal appearance. She is normal weight. She is not ill-appearing, toxic-appearing or diaphoretic.  HENT:     Head:  Normocephalic.  Eyes:     General: No scleral icterus.       Right eye: No discharge.        Left eye: No discharge.     Conjunctiva/sclera: Conjunctivae normal.  Pulmonary:     Effort: Pulmonary effort is normal.     Breath sounds: Transmitted upper airway sounds present. Examination of the left-upper field reveals decreased breath sounds. Decreased breath sounds present.       Comments: Egophony left lupper lobe  Musculoskeletal:        General: Normal range of motion.     Cervical back: Normal range of motion and neck supple.  Skin:    General: Skin is warm and dry.  Neurological:     General: No focal deficit present.     Mental Status: She is alert and oriented to person, place, and time. Mental status is at baseline.  Psychiatric:        Mood and Affect: Mood normal.        Behavior: Behavior normal.        Thought Content: Thought content normal.        Judgment: Judgment normal.     Lab Results  Component Value Date   HGBA1C 4.7 02/17/2023   HGBA1C 5.3 09/29/2021   HGBA1C 5.7 11/14/2016    Lab Results  Component Value Date   CREATININE 0.71 02/17/2023   CREATININE 0.71 09/29/2021   CREATININE 0.62 10/21/2020    Lab Results  Component Value Date   WBC 7.1 02/17/2023   HGB 14.4 02/17/2023   HCT 42.3 02/17/2023   PLT 242.0 02/17/2023   GLUCOSE 87 02/17/2023   CHOL 182 02/17/2023   TRIG 129.0 02/17/2023   HDL 48.10 02/17/2023   LDLDIRECT 104.0 02/17/2023   LDLCALC 108 (H) 02/17/2023   ALT 11 02/17/2023   AST 13 02/17/2023   NA 139 02/17/2023   K 4.3 02/17/2023   CL 103 02/17/2023   CREATININE 0.71 02/17/2023   BUN 14 02/17/2023   CO2 29 02/17/2023   TSH 0.41 02/17/2023   HGBA1C 4.7 02/17/2023    No results found.  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 MRN: [id number]  CC: The primary encounter diagnosis was Lower respiratory infection. 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  Since her symptoms started 4 days ago.  Fever curve has  been improving.  She has noted blood streaked purulent sinus drainage and green sputum.  She notes pain in the left upper back with cough and deep breathing,  similar to how she felt several years ago when she was diagnosed with bILATERAL pnemonia.  She did not have a follow up film to document resolution of infiltrates but she has been asymptomatic since then, is a lifelong non smoker,  and exercises regularly   2) Obesity:  she has  lost 40 lbs using  a GLP 1 agonist for appetite suppression  the compounded semiglutide every 10 days   Outpatient Medications Prior to Visit  Medication Sig Dispense Refill   botulinum toxin Type A  (BOTOX ) 200 units injection Inject 155 Units into the muscle every 3 (three) months. 1 each 1   cyanocobalamin  (VITAMIN B12) 1000 MCG/ML injection Inject 1 ml intramuscularly once weekly. 13 mL 3   Magnesium-Potassium-Pyridox 100-99-200 MG CAPS Take 1 tablet by mouth daily.     Multiple Vitamin (MULTIVITAMIN) tablet Take 1 tablet by mouth daily.     pantoprazole  (PROTONIX ) 40 MG tablet TAKE 1  TABLET DAILY 90 tablet 1   Semaglutide , 2 MG/DOSE, 8 MG/3ML SOPN Inject 2 mg as directed once a week. 3 mL 2   Syringe/Needle, Disp, (SYRINGE 3CC/25GX1") 25G X 1" 3 ML MISC Use for b12 injections 50 each 0   fluticasone  (FLONASE ) 50 MCG/ACT nasal spray Place 2 sprays into both nostrils daily. 16 g 6   hydrocortisone  (ANUSOL -HC) 2.5 % rectal cream Place 1 Application rectally 2 (two) times daily. (Patient not taking: Reported on 09/13/2023) 30 g 1   ondansetron  (ZOFRAN -ODT) 4 MG disintegrating tablet Take 1-2 tablets (4-8 mg total) by mouth every 8 (eight) hours as needed. (Patient not taking: Reported on 09/13/2023) 30 tablet 3   valACYclovir  (VALTREX ) 1000 MG tablet Take 2 tablets twice a day as needed for cold sore 20 tablet 3   Facility-Administered Medications Prior to Visit  Medication Dose Route Frequency Provider Last Rate Last Admin   Fremanezumab -vfrm SOSY 225 mg  225 mg Subcutaneous Once         Review of Systems;  Patient denies , unintentional weight loss, skin rash, eye pain, s dysphagia,  hemoptysis , dyspnea, wheezing, , palpitations, orthopnea, edema, abdominal pain, nausea, melena, diarrhea, constipation, flank pain, dysuria, hematuria, urinary  Frequency, nocturia, numbness, tingling, seizures,  Focal weakness, Loss of consciousness,  Tremor, insomnia, depression, anxiety, and suicidal ideation.      Objective:  BP 106/64   Pulse 71   Ht 5\' 4"  (1.626 m)  Wt 157 lb 6.4 oz (71.4 kg)   SpO2 99%   BMI 27.02 kg/m   BP Readings from Last 3 Encounters:  09/13/23 106/64  05/31/23 108/70  03/13/23 105/72    Wt Readings from Last 3 Encounters:  09/13/23 157 lb 6.4 oz (71.4 kg)  05/31/23 156 lb 6.4 oz (70.9 kg)  03/13/23 168 lb (76.2 kg)    Physical Exam Vitals reviewed.  Constitutional:      General: She is not in acute distress.    Appearance: Normal appearance. She is normal weight. She is not ill-appearing, toxic-appearing or diaphoretic.  HENT:     Head:  Normocephalic.  Eyes:     General: No scleral icterus.       Right eye: No discharge.        Left eye: No discharge.     Conjunctiva/sclera: Conjunctivae normal.  Pulmonary:     Effort: Pulmonary effort is normal.     Breath sounds: Transmitted upper airway sounds present. Examination of the left-upper field reveals decreased breath sounds. Decreased breath sounds present.       Comments: Egophony left lupper lobe  Musculoskeletal:        General: Normal range of motion.     Cervical back: Normal range of motion and neck supple.  Skin:    General: Skin is warm and dry.  Neurological:     General: No focal deficit present.     Mental Status: She is alert and oriented to person, place, and time. Mental status is at baseline.  Psychiatric:        Mood and Affect: Mood normal.        Behavior: Behavior normal.        Thought Content: Thought content normal.        Judgment: Judgment normal.     Lab Results  Component Value Date   HGBA1C 4.7 02/17/2023   HGBA1C 5.3 09/29/2021   HGBA1C 5.7 11/14/2016    Lab Results  Component Value Date   CREATININE 0.71 02/17/2023   CREATININE 0.71 09/29/2021   CREATININE 0.62 10/21/2020    Lab Results  Component Value Date   WBC 7.1 02/17/2023   HGB 14.4 02/17/2023   HCT 42.3 02/17/2023   PLT 242.0 02/17/2023   GLUCOSE 87 02/17/2023   CHOL 182 02/17/2023   TRIG 129.0 02/17/2023   HDL 48.10 02/17/2023   LDLDIRECT 104.0 02/17/2023   LDLCALC 108 (H) 02/17/2023   ALT 11 02/17/2023   AST 13 02/17/2023   NA 139 02/17/2023   K 4.3 02/17/2023   CL 103 02/17/2023   CREATININE 0.71 02/17/2023   BUN 14 02/17/2023   CO2 29 02/17/2023   TSH 0.41 02/17/2023   HGBA1C 4.7 02/17/2023    No results found.  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  Dispense: 16 g; Refill: 6  Other orders -     Amoxicillin -Pot Clavulanate; Take 1 tablet by mouth 2 (two) times daily.  Dispense: 14 tablet; Refill: 0 -     Benzonatate ; Take 1 capsule (200 mg total) by mouth 3 (three) times daily as needed for cough.  Dispense: 60 capsule; Refill: 1 -     predniSONE ; 6 tablets on Day 1 , then reduce by 1 tablet daily until gone  Dispense: 21 tablet; Refill: 0   Follow-up: No follow-ups on file.   Thersia Flax, MD

## 2023-09-13 NOTE — Assessment & Plan Note (Signed)
RESOLVED SINCE STARTING BOTOX INJECTIONS

## 2023-09-14 DIAGNOSIS — J22 Unspecified acute lower respiratory infection: Secondary | ICD-10-CM | POA: Insufficient documentation

## 2023-09-14 NOTE — Assessment & Plan Note (Signed)
 Empiric augmentin , prednisone  , topical decongestants  advised

## 2023-09-14 NOTE — Assessment & Plan Note (Signed)
 No evidence of infiltrate by my review of chest x ray done today ,  compared to 2022 film and 2019 films which were both NORMAL

## 2023-09-16 LAB — RESPIRATORY VIRUS PANEL
Adenovirus B: NOT DETECTED
HUMAN PARAINFLU VIRUS 1: NOT DETECTED
HUMAN PARAINFLU VIRUS 2: NOT DETECTED
HUMAN PARAINFLU VIRUS 3: NOT DETECTED
INFLUENZA A SUBTYPE H1: DETECTED — AB
INFLUENZA A SUBTYPE H3: NOT DETECTED
Influenza A: DETECTED — AB
Influenza B: NOT DETECTED
Metapneumovirus: NOT DETECTED
Respiratory Syncytial Virus A: NOT DETECTED
Respiratory Syncytial Virus B: NOT DETECTED
Rhinovirus: NOT DETECTED

## 2023-09-17 ENCOUNTER — Encounter: Payer: Self-pay | Admitting: Internal Medicine

## 2023-09-26 ENCOUNTER — Ambulatory Visit: Payer: 59 | Admitting: Adult Health

## 2023-09-26 DIAGNOSIS — G43711 Chronic migraine without aura, intractable, with status migrainosus: Secondary | ICD-10-CM | POA: Diagnosis not present

## 2023-09-26 MED ORDER — ONABOTULINUMTOXINA 200 UNITS IJ SOLR
155.0000 [IU] | Freq: Once | INTRAMUSCULAR | Status: AC
  Administered 2023-09-26: 165 [IU] via INTRAMUSCULAR

## 2023-09-26 NOTE — Progress Notes (Signed)
 returns for repeat Botox, prior injection 07/05/2023.  Reports continued great benefit of migraine headaches with ongoing use of Botox.  Has not had any severe migraine headaches over the past several months.  Continues to benefit from masseter injection with prior jaw pain contributing to migraines. Tolerated procedure well today. Will return in 3 months for repeat injections.         Consent Form Botulism Toxin Injection For Chronic Migraine    Reviewed orally with patient, additionally signature is on file:  Botulism toxin has been approved by the Federal drug administration for treatment of chronic migraine. Botulism toxin does not cure chronic migraine and it may not be effective in some patients.  The administration of botulism toxin is accomplished by injecting a small amount of toxin into the muscles of the neck and head. Dosage must be titrated for each individual. Any benefits resulting from botulism toxin tend to wear off after 3 months with a repeat injection required if benefit is to be maintained. Injections are usually done every 3-4 months with maximum effect peak achieved by about 2 or 3 weeks. Botulism toxin is expensive and you should be sure of what costs you will incur resulting from the injection.  The side effects of botulism toxin use for chronic migraine may include:   -Transient, and usually mild, facial weakness with facial injections  -Transient, and usually mild, head or neck weakness with head/neck injections  -Reduction or loss of forehead facial animation due to forehead muscle weakness  -Eyelid drooping  -Dry eye  -Pain at the site of injection or bruising at the site of injection  -Double vision  -Potential unknown long term risks   Contraindications: You should not have Botox if you are pregnant, nursing, allergic to albumin, have an infection, skin condition, or muscle weakness at the site of the injection, or have myasthenia gravis,  Lambert-Eaton syndrome, or ALS.  It is also possible that as with any injection, there may be an allergic reaction or no effect from the medication. Reduced effectiveness after repeated injections is sometimes seen and rarely infection at the injection site may occur. All care will be taken to prevent these side effects. If therapy is given over a long time, atrophy and wasting in the muscle injected may occur. Occasionally the patient's become refractory to treatment because they develop antibodies to the toxin. In this event, therapy needs to be modified.  I have read the above information and consent to the administration of botulism toxin.    BOTOX PROCEDURE NOTE FOR MIGRAINE HEADACHE  Contraindications and precautions discussed with patient(above). Aseptic procedure was observed and patient tolerated procedure. Procedure performed by Ihor Austin, AGNP-BC.   The condition has existed for more than 6 months, and pt does not have a diagnosis of ALS, Myasthenia Gravis or Lambert-Eaton Syndrome.  Risks and benefits of injections discussed and pt agrees to proceed with the procedure.  Written consent obtained  These injections are medically necessary. Pt  receives good benefits from these injections. These injections do not cause sedations or hallucinations which the oral therapies may cause.   Description of procedure:  The patient was placed in a sitting position. The standard protocol was used for Botox as follows, with 5 units of Botox injected at each site:  -Procerus muscle, midline injection  -Corrugator muscle, bilateral injection  -Frontalis muscle, bilateral injection, with 2 sites each side, medial injection was performed in the upper one third of the frontalis muscle,  in the region vertical from the medial inferior edge of the superior orbital rim. The lateral injection was again in the upper one third of the forehead vertically above the lateral limbus of the cornea, 1.5 cm  lateral to the medial injection site.  -Temporalis muscle injection, 4 sites, bilaterally. The first injection was 3 cm above the tragus of the ear, second injection site was 1.5 cm to 3 cm up from the first injection site in line with the tragus of the ear. The third injection site was 1.5-3 cm forward between the first 2 injection sites. The fourth injection site was 1.5 cm posterior to the second injection site. 5th site laterally in the temporalis  muscleat the level of the outer canthus.  -Occipitalis muscle injection, 3 sites, bilaterally. The first injection was done one half way between the occipital protuberance and the tip of the mastoid process behind the ear. The second injection site was done lateral and superior to the first, 1 fingerbreadth from the first injection. The third injection site was 1 fingerbreadth superiorly and medially from the first injection site.  -Cervical paraspinal muscle injection, 2 sites, bilaterally. The first injection site was 1 cm from the midline of the cervical spine, 3 cm inferior to the lower border of the occipital protuberance. The second injection site was 1.5 cm superiorly and laterally to the first injection site.  -Trapezius muscle injection was performed at 3 sites, bilaterally. The first injection site was in the upper trapezius muscle halfway between the inflection point of the neck, and the acromion. The second injection site was one half way between the acromion and the first injection site. The third injection was done between the first injection site and the inflection point of the neck.  -Masseter muscle injection performed at 1 site bilaterally.      A total of 200 units of Botox was prepared, 165 units of Botox was injected as documented above, any Botox not injected was wasted. The patient tolerated the procedure well, there were no complications of the above procedure.   Ihor Austin, AGNP-BC  [facility] [address] [place], [place] [address]  Phone [phone] Fax [phone] Note: This document was prepared with digital dictation and possible smart phrase technology. Any transcriptional errors that result from this process are unintentional.

## 2023-09-26 NOTE — Progress Notes (Signed)
 Botox- 200 units x 1 vial Lot: [id number] Expiration: 2027/04 NDC: 0023-3921-02  Bacteriostatic 0.9% Sodium Chloride- 4mL  Lot: [id number] Expiration: 06/08/24 NDC: 0454098119  Dx: J47.829  S/P  Witnessed by Garner Gavel CMA

## 2023-09-27 ENCOUNTER — Ambulatory Visit: Payer: BC Managed Care – PPO | Admitting: Adult Health

## 2023-09-28 ENCOUNTER — Encounter: Payer: Self-pay | Admitting: Internal Medicine

## 2023-12-05 ENCOUNTER — Encounter: Payer: Self-pay | Admitting: Internal Medicine

## 2023-12-06 DIAGNOSIS — J9801 Acute bronchospasm: Secondary | ICD-10-CM | POA: Insufficient documentation

## 2023-12-06 NOTE — Assessment & Plan Note (Signed)
 Given severity of symptoms will need to rule out RSV

## 2023-12-06 NOTE — Telephone Encounter (Signed)
 Patient DX code would need to be changed and note would need to reflect this DX and then patient would need to call [employer] and appeal.

## 2023-12-14 MED ORDER — ONABOTULINUMTOXINA 200 UNITS IJ SOLR: 155.0000 [IU] | INTRAMUSCULAR | 1 refills | Status: DC

## 2023-12-14 NOTE — Addendum Note (Signed)
 Addended by: Delray Fielding D on: 12/14/2023 02:46 PM   Modules accepted: Orders

## 2023-12-14 NOTE — Telephone Encounter (Signed)
 Rx sent.

## 2023-12-14 NOTE — Telephone Encounter (Signed)
 Please send Botox refills to [facility], thank you!

## 2023-12-20 ENCOUNTER — Ambulatory Visit: Payer: 59 | Admitting: Adult Health

## 2023-12-27 ENCOUNTER — Encounter: Payer: Self-pay | Admitting: Internal Medicine

## 2023-12-27 ENCOUNTER — Ambulatory Visit: Payer: 59 | Admitting: Adult Health

## 2023-12-27 DIAGNOSIS — G43711 Chronic migraine without aura, intractable, with status migrainosus: Secondary | ICD-10-CM

## 2023-12-27 MED ORDER — ONABOTULINUMTOXINA 200 UNITS IJ SOLR
155.0000 [IU] | Freq: Once | INTRAMUSCULAR | Status: AC
  Administered 2023-12-27: 165 [IU] via INTRAMUSCULAR

## 2023-12-27 NOTE — Progress Notes (Signed)
 Update 12/27/2023 JM: returns for repeat Botox , prior injection 09/26/2023.  Has been having some increased tension type headaches over the past several months with pressure sensation. Has about 1 migraine headaches per week. Prior to botox , having headaches daily and about 10 moderate to severe migraine headaches per month. Reports continued great benefit with masseter injections with prior jaw pain contributing to migraines. Sample provided for Qulipta to additional migraine benefit and Nurtec as needed for rescue. She will call if beneficial for official prescription. Tolerated procedure well today. Will return in 3 months for repeat injections.   Meds tried > 2 months: tylenol , magnesium, zofran , prednisone , phenergan , gabapentin,amitriptyline, topiramate, BP medications are contraindicated due to hypotension usually runs systolic about 100 so cannot take propranolol. Sumatriptan.  Rizatriptan .  Aimovig contraindicated due to constipation. Ajovy  (rash), hesitant to trial Emgality due to prior rash with Ajovy       Consent Form Botulism Toxin Injection For Chronic Migraine    Reviewed orally with patient, additionally signature is on file:  Botulism toxin has been approved by the Federal drug administration for treatment of chronic migraine. Botulism toxin does not cure chronic migraine and it may not be effective in some patients.  The administration of botulism toxin is accomplished by injecting a small amount of toxin into the muscles of the neck and head. Dosage must be titrated for each individual. Any benefits resulting from botulism toxin tend to wear off after 3 months with a repeat injection required if benefit is to be maintained. Injections are usually done every 3-4 months with maximum effect peak achieved by about 2 or 3 weeks. Botulism toxin is expensive and you should be sure of what costs you will incur resulting from the injection.  The side effects of botulism toxin use  for chronic migraine may include:   -Transient, and usually mild, facial weakness with facial injections  -Transient, and usually mild, head or neck weakness with head/neck injections  -Reduction or loss of forehead facial animation due to forehead muscle weakness  -Eyelid drooping  -Dry eye  -Pain at the site of injection or bruising at the site of injection  -Double vision  -Potential unknown long term risks   Contraindications: You should not have Botox  if you are pregnant, nursing, allergic to albumin, have an infection, skin condition, or muscle weakness at the site of the injection, or have myasthenia gravis, Lambert-Eaton syndrome, or ALS.  It is also possible that as with any injection, there may be an allergic reaction or no effect from the medication. Reduced effectiveness after repeated injections is sometimes seen and rarely infection at the injection site may occur. All care will be taken to prevent these side effects. If therapy is given over a long time, atrophy and wasting in the muscle injected may occur. Occasionally the patient's become refractory to treatment because they develop antibodies to the toxin. In this event, therapy needs to be modified.  I have read the above information and consent to the administration of botulism toxin.    BOTOX  PROCEDURE NOTE FOR MIGRAINE HEADACHE  Contraindications and precautions discussed with patient(above). Aseptic procedure was observed and patient tolerated procedure. Procedure performed by Johny Nap, AGNP-BC.   The condition has existed for more than 6 months, and pt does not have a diagnosis of ALS, Myasthenia Gravis or Lambert-Eaton Syndrome.  Risks and benefits of injections discussed and pt agrees to proceed with the procedure.  Written consent obtained  These injections are medically necessary.  Pt  receives good benefits from these injections. These injections do not cause sedations or hallucinations which the oral  therapies may cause.   Description of procedure:  The patient was placed in a sitting position. The standard protocol was used for Botox  as follows, with 5 units of Botox  injected at each site:  -Procerus muscle, midline injection  -Corrugator muscle, bilateral injection  -Frontalis muscle, bilateral injection, with 2 sites each side, medial injection was performed in the upper one third of the frontalis muscle, in the region vertical from the medial inferior edge of the superior orbital rim. The lateral injection was again in the upper one third of the forehead vertically above the lateral limbus of the cornea, 1.5 cm lateral to the medial injection site.  -Temporalis muscle injection, 4 sites, bilaterally. The first injection was 3 cm above the tragus of the ear, second injection site was 1.5 cm to 3 cm up from the first injection site in line with the tragus of the ear. The third injection site was 1.5-3 cm forward between the first 2 injection sites. The fourth injection site was 1.5 cm posterior to the second injection site. 5th site laterally in the temporalis  muscleat the level of the outer canthus.  -Occipitalis muscle injection, 3 sites, bilaterally. The first injection was done one half way between the occipital protuberance and the tip of the mastoid process behind the ear. The second injection site was done lateral and superior to the first, 1 fingerbreadth from the first injection. The third injection site was 1 fingerbreadth superiorly and medially from the first injection site.  -Cervical paraspinal muscle injection, 2 sites, bilaterally. The first injection site was 1 cm from the midline of the cervical spine, 3 cm inferior to the lower border of the occipital protuberance. The second injection site was 1.5 cm superiorly and laterally to the first injection site.  -Trapezius muscle injection was performed at 3 sites, bilaterally. The first injection site was in the upper trapezius  muscle halfway between the inflection point of the neck, and the acromion. The second injection site was one half way between the acromion and the first injection site. The third injection was done between the first injection site and the inflection point of the neck.  -Masseter muscle injection performed at 1 site bilaterally.      A total of 200 units of Botox  was prepared, 165 units of Botox  was injected as documented above, any Botox  not injected was wasted. The patient tolerated the procedure well, there were no complications of the above procedure.   Johny Nap, AGNP-BC  [facility] [address] [place], [place] [address]  Phone [phone] Fax [phone] Note: This document was prepared with digital dictation and possible smart phrase technology. Any transcriptional errors that result from this process are unintentional.

## 2023-12-27 NOTE — Progress Notes (Signed)
 Botox - 200 units x 1 vial Lot: [id number] Expiration: 06/06/2026 NDC: 7253-6644-03  Bacteriostatic 0.9% Sodium Chloride - 4 mL  Lot: [id number] Expiration: 02/04/2025 NDC: 5638-7564-33  Dx: I95.188  S/P  Witnessed by Beauford Bounds, RN

## 2023-12-28 MED ORDER — SEMAGLUTIDE (2 MG/DOSE) 8 MG/3ML ~~LOC~~ SOPN: 2.0000 mg | PEN_INJECTOR | SUBCUTANEOUS | 0 refills | Status: DC

## 2024-01-30 ENCOUNTER — Other Ambulatory Visit: Payer: Self-pay | Admitting: Internal Medicine

## 2024-02-21 ENCOUNTER — Telehealth: Payer: Self-pay | Admitting: Adult Health

## 2024-02-21 NOTE — Telephone Encounter (Signed)
 Pt made request to r/s due to conflict with work schedule, pt also on wait list

## 2024-02-27 ENCOUNTER — Other Ambulatory Visit: Payer: Self-pay | Admitting: Internal Medicine

## 2024-03-03 NOTE — Telephone Encounter (Signed)
 Last read by Annabella Mani at 3:50PM on 02/29/2024.

## 2024-03-04 ENCOUNTER — Telehealth: Payer: Self-pay | Admitting: Internal Medicine

## 2024-03-04 ENCOUNTER — Telehealth: Admitting: Internal Medicine

## 2024-03-04 VITALS — BP 105/68 | HR 76 | Ht 64.0 in | Wt 157.2 lb

## 2024-03-04 DIAGNOSIS — E538 Deficiency of other specified B group vitamins: Secondary | ICD-10-CM | POA: Diagnosis not present

## 2024-03-04 DIAGNOSIS — E66811 Obesity, class 1: Secondary | ICD-10-CM | POA: Diagnosis not present

## 2024-03-04 MED ORDER — CYANOCOBALAMIN 1000 MCG/ML IJ SOLN: INTRAMUSCULAR | 3 refills | Status: AC

## 2024-03-04 MED ORDER — SYRINGE 25G X 1" 3 ML MISC: 0 refills | Status: AC

## 2024-03-04 MED ORDER — SEMAGLUTIDE (2 MG/DOSE) 8 MG/3ML ~~LOC~~ SOPN: 2.0000 mg | PEN_INJECTOR | SUBCUTANEOUS | 0 refills | Status: DC

## 2024-03-04 NOTE — Assessment & Plan Note (Signed)
-   This problem is chronic and stable -Patient needs a refill of her vitamin B12 injections as well as syringes -Patient states that she has been on these for years - She likely has B12 deficiency from chronic use of PPI -Her last vitamin B12 level was in 2023 and was 543 -She will continue with monthly vitamin B12 injections for now -She will follow-up with Dr. Marylynn later this year for blood work -Will refill these medications for her -No further workup at this time

## 2024-03-04 NOTE — Progress Notes (Signed)
 Virtual Visit via Video Note  I connected with Sheri Guerrero on 03/04/24 at  2:40 PM EDT by a video enabled telemedicine application and verified that I am speaking with the correct person using two identifiers.  Location patient: home Location provider:work or home office Persons participating in the virtual visit: patient, provider  I discussed the limitations of evaluation and management by telemedicine and the availability of in person appointments. The patient expressed understanding and agreed to proceed.   HPI:  Patient had a virtual visit with me today in order to refill her vitamin B12 injections as well as compounded semaglutide .  Patient has been on vitamin B12 injections for years for vitamin B12 deficiency.  Her last vitamin B12 level was in 2023 and was 543.  She states that she is able to give the injections to herself and does this on a monthly cadence.  She will follow-up with Dr. Tullo later this year for blood work.  Patient also states that she has been on compounded semaglutide  and has lost 46 pounds on this medication.  She denies any nausea or vomiting or abdominal pain.  States that she has been slowly tapering herself down on this medication.  Would like to continue taking the medication to help maintain her weight.  And  ROS: See pertinent positives and negatives per HPI.  Past Medical History:  Diagnosis Date   Abnormal weight gain    Anemia    Anxiety    Depression     Past Surgical History:  Procedure Laterality Date   ABLATION     uterine; approx 2017-2018   COLONOSCOPY WITH PROPOFOL  N/A 04/01/2021   Procedure: COLONOSCOPY WITH PROPOFOL ;  Surgeon: Unk Corinn Skiff, MD;  Location: [facility];  Service: Endoscopy;  Laterality: N/A;   KNEE SURGERY Right    age 29   TONSILLECTOMY AND ADENOIDECTOMY  age 22    Family History  Problem Relation Age of Onset   Cancer Mother        had cervical CA, melanoma, then Stage 4 adeno CA of the lung    Stroke Father    Liver cancer Father    Cancer Paternal Grandmother        breat     Current Outpatient Medications:    botulinum toxin Type A  (BOTOX ) 200 units injection, Inject 155 Units into the muscle every 3 (three) months., Disp: 1 each, Rfl: 1   fluticasone  (FLONASE ) 50 MCG/ACT nasal spray, Place 2 sprays into both nostrils daily., Disp: 16 g, Rfl: 6   Magnesium-Potassium-Pyridox 100-99-200 MG CAPS, Take 1 tablet by mouth daily., Disp: , Rfl:    Multiple Vitamin (MULTIVITAMIN) tablet, Take 1 tablet by mouth daily., Disp: , Rfl:    pantoprazole  (PROTONIX ) 40 MG tablet, TAKE 1 TABLET DAILY, Disp: 90 tablet, Rfl: 1   valACYclovir  (VALTREX ) 1000 MG tablet, Take 2 tablets twice a day as needed for cold sore, Disp: 20 tablet, Rfl: 3   cyanocobalamin  (VITAMIN B12) 1000 MCG/ML injection, Inject 1 ml intramuscularly once weekly., Disp: 13 mL, Rfl: 3   Semaglutide , 2 MG/DOSE, 8 MG/3ML SOPN, Inject 2 mg as directed once a week., Disp: 3 mL, Rfl: 0   Syringe/Needle, Disp, (SYRINGE 3CC/25GX1) 25G X 1 3 ML MISC, Use for b12 injections, Disp: 50 each, Rfl: 0  EXAM:  VITALS per patient if applicable:  GENERAL: alert, oriented, appears well and in no acute distress  HEENT: atraumatic, conjunttiva clear, no obvious abnormalities on inspection of external nose and  ears  NECK: normal movements of the head and neck  LUNGS: on inspection no signs of respiratory distress, breathing rate appears normal, no obvious gross SOB, gasping or wheezing  CV: no obvious cyanosis  MS: moves all visible extremities without noticeable abnormality  PSYCH/NEURO: pleasant and cooperative, no obvious depression or anxiety, speech and thought processing grossly intact  ASSESSMENT AND PLAN:  Discussed the following assessment and plan:  B12 deficiency - Plan: Syringe/Needle, Disp, (SYRINGE 3CC/25GX1) 25G X 1 3 ML MISC, cyanocobalamin  (VITAMIN B12) 1000 MCG/ML injection  Obesity (BMI 30.0-34.9) - Plan:  Semaglutide , 2 MG/DOSE, 8 MG/3ML SOPN     I discussed the assessment and treatment plan with the patient. The patient was provided an opportunity to ask questions and all were answered. The patient agreed with the plan and demonstrated an understanding of the instructions.   The patient was advised to call back or seek an in-person evaluation if the symptoms worsen or if the condition fails to improve as anticipated.     Mahdi Frye, MD

## 2024-03-04 NOTE — Telephone Encounter (Unsigned)
 Copied from CRM [id number]. Topic: General - Inquiry >> Mar 04, 2024  9:03 AM Gennette ORN wrote: Patient got a message in my chart to schedule her appointment for a medication refill but no appointments are available until September. Please follow up with patient.  [phone].

## 2024-03-04 NOTE — Telephone Encounter (Signed)
 Noted

## 2024-03-04 NOTE — Assessment & Plan Note (Signed)
-   This problem is chronic and stable -Patient has lost over 40 pounds on compounded semaglutide  and is at her target weight -She has tapered herself down to 1 mg weekly currently -She denies any side effects including nausea/vomiting or abdominal pain -She is attempting to wean herself off this medication over the course of this year -Will refill her semaglutide  at [facility] pharmacy -No further workup at this time

## 2024-03-04 NOTE — Addendum Note (Signed)
 Addended by: Estelle Skibicki on: 03/04/2024 03:07 PM   Modules accepted: Level of Service

## 2024-03-05 ENCOUNTER — Other Ambulatory Visit: Payer: Self-pay | Admitting: Internal Medicine

## 2024-03-19 ENCOUNTER — Ambulatory Visit: Admitting: Adult Health

## 2024-03-19 VITALS — BP 123/87 | HR 78

## 2024-03-19 DIAGNOSIS — G43711 Chronic migraine without aura, intractable, with status migrainosus: Secondary | ICD-10-CM

## 2024-03-19 MED ORDER — ONABOTULINUMTOXINA 200 UNITS IJ SOLR
155.0000 [IU] | Freq: Once | INTRAMUSCULAR | Status: AC
  Administered 2024-03-19 (×2): 155 [IU] via INTRAMUSCULAR

## 2024-03-19 NOTE — Progress Notes (Signed)
 Update 03/19/2024 JM: returns for repeat Botox , prior injection 12/27/2023.  Reports continued benefit with botox  injections with >50% migraine reduction. She has had some increased headaches over the past 2 weeks which she attributes to dental procedures and increased stress.  Previously tried [place] but denies benefit.  Previously tried [employer]	[place] but denies benefit.  Provided sample for Ubrelvy , will call if beneficial for official prescription. Tolerated procedure well today. Will return in 3 months for repeat injections.   Meds tried > 2 months: tylenol , magnesium, zofran , prednisone , phenergan , gabapentin,amitriptyline, topiramate, BP medications are contraindicated due to hypotension usually runs systolic about 100 so cannot take propranolol. Sumatriptan.  Rizatriptan .  Nurtec, Qulipta, Aimovig contraindicated due to constipation. Ajovy  (rash), hesitant to trial Emgality due to prior rash with Ajovy       Consent Form Botulism Toxin Injection For Chronic Migraine    Reviewed orally with patient, additionally signature is on file:  Botulism toxin has been approved by the Federal drug administration for treatment of chronic migraine. Botulism toxin does not cure chronic migraine and it may not be effective in some patients.  The administration of botulism toxin is accomplished by injecting a small amount of toxin into the muscles of the neck and head. Dosage must be titrated for each individual. Any benefits resulting from botulism toxin tend to wear off after 3 months with a repeat injection required if benefit is to be maintained. Injections are usually done every 3-4 months with maximum effect peak achieved by about 2 or 3 weeks. Botulism toxin is expensive and you should be sure of what costs you will incur resulting from the injection.  The side effects of botulism toxin use for chronic migraine may include:   -Transient, and usually mild, facial weakness with facial  injections  -Transient, and usually mild, head or neck weakness with head/neck injections  -Reduction or loss of forehead facial animation due to forehead muscle weakness  -Eyelid drooping  -Dry eye  -Pain at the site of injection or bruising at the site of injection  -Double vision  -Potential unknown long term risks   Contraindications: You should not have Botox  if you are pregnant, nursing, allergic to albumin, have an infection, skin condition, or muscle weakness at the site of the injection, or have myasthenia gravis, Lambert-Eaton syndrome, or ALS.  It is also possible that as with any injection, there may be an allergic reaction or no effect from the medication. Reduced effectiveness after repeated injections is sometimes seen and rarely infection at the injection site may occur. All care will be taken to prevent these side effects. If therapy is given over a long time, atrophy and wasting in the muscle injected may occur. Occasionally the patient's become refractory to treatment because they develop antibodies to the toxin. In this event, therapy needs to be modified.  I have read the above information and consent to the administration of botulism toxin.    BOTOX  PROCEDURE NOTE FOR MIGRAINE HEADACHE  Contraindications and precautions discussed with patient(above). Aseptic procedure was observed and patient tolerated procedure. Procedure performed by Harlene Bogaert, AGNP-BC.   The condition has existed for more than 6 months, and pt does not have a diagnosis of ALS, Myasthenia Gravis or Lambert-Eaton Syndrome.  Risks and benefits of injections discussed and pt agrees to proceed with the procedure.  Written consent obtained  These injections are medically necessary. Pt  receives good benefits from these injections. These injections do not cause sedations or hallucinations which  the oral therapies may cause.   Description of procedure:  The patient was placed in a sitting position.  The standard protocol was used for Botox  as follows, with 5 units of Botox  injected at each site:  -Procerus muscle, midline injection  -Corrugator muscle, bilateral injection  -Frontalis muscle, bilateral injection, with 2 sites each side, medial injection was performed in the upper one third of the frontalis muscle, in the region vertical from the medial inferior edge of the superior orbital rim. The lateral injection was again in the upper one third of the forehead vertically above the lateral limbus of the cornea, 1.5 cm lateral to the medial injection site.  -Temporalis muscle injection, 4 sites, bilaterally. The first injection was 3 cm above the tragus of the ear, second injection site was 1.5 cm to 3 cm up from the first injection site in line with the tragus of the ear. The third injection site was 1.5-3 cm forward between the first 2 injection sites. The fourth injection site was 1.5 cm posterior to the second injection site. 5th site laterally in the temporalis  muscleat the level of the outer canthus.  -Occipitalis muscle injection, 3 sites, bilaterally. The first injection was done one half way between the occipital protuberance and the tip of the mastoid process behind the ear. The second injection site was done lateral and superior to the first, 1 fingerbreadth from the first injection. The third injection site was 1 fingerbreadth superiorly and medially from the first injection site.  -Cervical paraspinal muscle injection, 2 sites, bilaterally. The first injection site was 1 cm from the midline of the cervical spine, 3 cm inferior to the lower border of the occipital protuberance. The second injection site was 1.5 cm superiorly and laterally to the first injection site.  -Trapezius muscle injection was performed at 3 sites, bilaterally. The first injection site was in the upper trapezius muscle halfway between the inflection point of the neck, and the acromion. The second injection site  was one half way between the acromion and the first injection site. The third injection was done between the first injection site and the inflection point of the neck.       A total of 200 units of Botox  was prepared, 155 units of Botox  was injected as documented above, any Botox  not injected was wasted. The patient tolerated the procedure well, there were no complications of the above procedure.   Harlene Bogaert, AGNP-BC  [facility] [address] [place], [place] [address]  Phone [phone] Fax [phone] Note: This document was prepared with digital dictation and possible smart phrase technology. Any transcriptional errors that result from this process are unintentional.

## 2024-03-19 NOTE — Progress Notes (Signed)
 Botox - 200 units x 1 vial Lot: [id number] Expiration: 11/05/2026 NDC: 9976-6078-97   Bacteriostatic 0.9% Sodium Chloride - 4 mL  Lot: [id number] Expiration: 06/06/2025 NDC: 9590-8033-97   Dx: H56.288  S/P  Witnessed by Augustin NOVAK

## 2024-03-25 ENCOUNTER — Ambulatory Visit: Admitting: Adult Health

## 2024-04-09 ENCOUNTER — Ambulatory Visit: Admitting: Adult Health

## 2024-05-03 ENCOUNTER — Encounter: Payer: Self-pay | Admitting: Internal Medicine

## 2024-05-03 ENCOUNTER — Telehealth: Payer: Self-pay

## 2024-05-03 ENCOUNTER — Ambulatory Visit: Admitting: Internal Medicine

## 2024-05-03 ENCOUNTER — Other Ambulatory Visit (HOSPITAL_COMMUNITY): Payer: Self-pay

## 2024-05-03 VITALS — BP 114/70 | HR 83 | Ht 64.0 in | Wt 160.4 lb

## 2024-05-03 DIAGNOSIS — Z7689 Persons encountering health services in other specified circumstances: Secondary | ICD-10-CM

## 2024-05-03 DIAGNOSIS — K591 Functional diarrhea: Secondary | ICD-10-CM

## 2024-05-03 DIAGNOSIS — R5383 Other fatigue: Secondary | ICD-10-CM

## 2024-05-03 DIAGNOSIS — Z23 Encounter for immunization: Secondary | ICD-10-CM

## 2024-05-03 DIAGNOSIS — R7301 Impaired fasting glucose: Secondary | ICD-10-CM

## 2024-05-03 DIAGNOSIS — E785 Hyperlipidemia, unspecified: Secondary | ICD-10-CM | POA: Diagnosis not present

## 2024-05-03 DIAGNOSIS — K219 Gastro-esophageal reflux disease without esophagitis: Secondary | ICD-10-CM

## 2024-05-03 DIAGNOSIS — E538 Deficiency of other specified B group vitamins: Secondary | ICD-10-CM

## 2024-05-03 DIAGNOSIS — E66811 Obesity, class 1: Secondary | ICD-10-CM

## 2024-05-03 DIAGNOSIS — Z6827 Body mass index (BMI) 27.0-27.9, adult: Secondary | ICD-10-CM

## 2024-05-03 LAB — LIPID PANEL
Cholesterol: 204 mg/dL — ABNORMAL HIGH (ref 0–200)
HDL: 64.3 mg/dL (ref >39.00)
LDL Cholesterol: 120 mg/dL — ABNORMAL HIGH (ref 0–99)
NonHDL: 139.68
Total CHOL/HDL Ratio: 3
Triglycerides: 100 mg/dL (ref 0.0–149.0)
VLDL: 20 mg/dL (ref 0.0–40.0)

## 2024-05-03 LAB — COMPREHENSIVE METABOLIC PANEL WITH GFR
ALT: 14 U/L (ref 0–35)
AST: 17 U/L (ref 0–37)
Albumin: 4.6 g/dL (ref 3.5–5.2)
Alkaline Phosphatase: 68 U/L (ref 39–117)
BUN: 12 mg/dL (ref 6–23)
CO2: 33 meq/L — ABNORMAL HIGH (ref 19–32)
Calcium: 9.7 mg/dL (ref 8.4–10.5)
Chloride: 100 meq/L (ref 96–112)
Creatinine, Ser: 0.74 mg/dL (ref 0.40–1.20)
GFR: 93.2 mL/min (ref >60.00)
Glucose, Bld: 86 mg/dL (ref 70–99)
Potassium: 4.7 meq/L (ref 3.5–5.1)
Sodium: 139 meq/L (ref 135–145)
Total Bilirubin: 0.7 mg/dL (ref 0.2–1.2)
Total Protein: 7.1 g/dL (ref 6.0–8.3)

## 2024-05-03 LAB — CBC WITH DIFFERENTIAL/PLATELET
Basophils Absolute: 0 K/uL (ref 0.0–0.1)
Basophils Relative: 0.6 % (ref 0.0–3.0)
Eosinophils Absolute: 0.1 K/uL (ref 0.0–0.7)
Eosinophils Relative: 2.3 % (ref 0.0–5.0)
HCT: 42.2 % (ref 36.0–46.0)
Hemoglobin: 14.4 g/dL (ref 12.0–15.0)
Lymphocytes Relative: 32.2 % (ref 12.0–46.0)
Lymphs Abs: 1.6 K/uL (ref 0.7–4.0)
MCHC: 34.1 g/dL (ref 30.0–36.0)
MCV: 94.5 fl (ref 78.0–100.0)
Monocytes Absolute: 0.4 K/uL (ref 0.1–1.0)
Monocytes Relative: 6.8 % (ref 3.0–12.0)
Neutro Abs: 3 K/uL (ref 1.4–7.7)
Neutrophils Relative %: 58.1 % (ref 43.0–77.0)
Platelets: 236 K/uL (ref 150.0–400.0)
RBC: 4.46 Mil/uL (ref 3.87–5.11)
RDW: 12.5 % (ref 11.5–15.5)
WBC: 5.1 K/uL (ref 4.0–10.5)

## 2024-05-03 LAB — TSH: TSH: 0.68 u[IU]/mL (ref 0.35–5.50)

## 2024-05-03 LAB — LDL CHOLESTEROL, DIRECT: Direct LDL: 127 mg/dL

## 2024-05-03 LAB — MAGNESIUM: Magnesium: 2.2 mg/dL (ref 1.5–2.5)

## 2024-05-03 MED ORDER — SEMAGLUTIDE (2 MG/DOSE) 8 MG/3ML ~~LOC~~ SOPN: 2.0000 mg | PEN_INJECTOR | SUBCUTANEOUS | 0 refills | Status: DC

## 2024-05-03 MED ORDER — PANTOPRAZOLE SODIUM 40 MG PO TBEC: 40.0000 mg | DELAYED_RELEASE_TABLET | Freq: Every day | ORAL | 1 refills | Status: AC

## 2024-05-03 MED ORDER — SEMAGLUTIDE (2 MG/DOSE) 8 MG/3ML ~~LOC~~ SOPN: 2.0000 mg | PEN_INJECTOR | SUBCUTANEOUS | 5 refills | Status: AC

## 2024-05-03 NOTE — Progress Notes (Signed)
 Subjective:  Patient ID: Sheri Guerrero, female    DOB: Feb 14, 1971  Age: 53 y.o. MRN: [id number]  CC: The primary encounter diagnosis was Impaired fasting glucose. Diagnoses of Dyslipidemia, Other fatigue, Obesity (BMI 30.0-34.9), Functional diarrhea, Gastroesophageal reflux disease without esophagitis, Need for Tdap vaccination, B12 deficiency, and Encounter for weight management were also pertinent to this visit.   HPI Sheri Guerrero presents for  Chief Complaint  Patient presents with   Medical Management of Chronic Issues   1) weight management.  Last seen in February , she has had a weight loss of 50 lbs since starting Wegovy . She feels generally an improvement in her energy level , and less joint pain with the loss of weight.  She is exercising regularly.  Constipation managed with supplement  Mag 07 taken at night       Outpatient Medications Prior to Visit  Medication Sig Dispense Refill   botulinum toxin Type A  (BOTOX ) 200 units injection Inject 155 Units into the muscle every 3 (three) months. 1 each 1   cyanocobalamin  (VITAMIN B12) 1000 MCG/ML injection Inject 1 ml intramuscularly once weekly. 13 mL 3   fluticasone  (FLONASE ) 50 MCG/ACT nasal spray Place 2 sprays into both nostrils daily. 16 g 6   Magnesium-Potassium-Pyridox 100-99-200 MG CAPS Take 1 tablet by mouth daily.     Multiple Vitamin (MULTIVITAMIN) tablet Take 1 tablet by mouth daily.     Syringe/Needle, Disp, (SYRINGE 3CC/25GX1) 25G X 1 3 ML MISC Use for b12 injections 50 each 0   valACYclovir  (VALTREX ) 1000 MG tablet Take 2 tablets twice a day as needed for cold sore 20 tablet 3   pantoprazole  (PROTONIX ) 40 MG tablet TAKE 1 TABLET DAILY 90 tablet 1   Semaglutide , 2 MG/DOSE, 8 MG/3ML SOPN Inject 2 mg as directed once a week. 3 mL 0   No facility-administered medications prior to visit.    Review of Systems;  Patient denies headache, fevers, malaise, unintentional weight loss, skin rash, eye pain, sinus  congestion and sinus pain, sore throat, dysphagia,  hemoptysis , cough, dyspnea, wheezing, chest pain, palpitations, orthopnea, edema, abdominal pain, nausea, melena, diarrhea, constipation, flank pain, dysuria, hematuria, urinary  Frequency, nocturia, numbness, tingling, seizures,  Focal weakness, Loss of consciousness,  Tremor, insomnia, depression, anxiety, and suicidal ideation.      Objective:  BP 114/70   Pulse 83   Ht 5' 4 (1.626 m)   Wt 160 lb 6.4 oz (72.8 kg)   SpO2 97%   BMI 27.53 kg/m   BP Readings from Last 3 Encounters:  05/03/24 114/70  03/19/24 123/87  03/04/24 105/68    Wt Readings from Last 3 Encounters:  05/03/24 160 lb 6.4 oz (72.8 kg)  03/04/24 157 lb 3.2 oz (71.3 kg)  09/13/23 157 lb 6.4 oz (71.4 kg)    Physical Exam Vitals reviewed.  Constitutional:      General: She is not in acute distress.    Appearance: Normal appearance. She is normal weight. She is not ill-appearing, toxic-appearing or diaphoretic.  HENT:     Head: Normocephalic.  Eyes:     General: No scleral icterus.       Right eye: No discharge.        Left eye: No discharge.     Conjunctiva/sclera: Conjunctivae normal.  Cardiovascular:     Rate and Rhythm: Normal rate and regular rhythm.     Heart sounds: Normal heart sounds.  Pulmonary:     Effort: Pulmonary effort is normal. No  respiratory distress.     Breath sounds: Normal breath sounds.  Musculoskeletal:        General: Normal range of motion.  Skin:    General: Skin is warm and dry.  Neurological:     General: No focal deficit present.     Mental Status: She is alert and oriented to person, place, and time. Mental status is at baseline.  Psychiatric:        Mood and Affect: Mood normal.        Behavior: Behavior normal.        Thought Content: Thought content normal.        Judgment: Judgment normal.     Lab Results  Component Value Date   HGBA1C 4.7 02/17/2023   HGBA1C 5.3 09/29/2021   HGBA1C 5.7 11/14/2016     Lab Results  Component Value Date   CREATININE 0.74 05/03/2024   CREATININE 0.71 02/17/2023   CREATININE 0.71 09/29/2021    Lab Results  Component Value Date   WBC 5.1 05/03/2024   HGB 14.4 05/03/2024   HCT 42.2 05/03/2024   PLT 236.0 05/03/2024   GLUCOSE 86 05/03/2024   CHOL 204 (H) 05/03/2024   TRIG 100.0 05/03/2024   HDL 64.30 05/03/2024   LDLDIRECT 127.0 05/03/2024   LDLCALC 120 (H) 05/03/2024   ALT 14 05/03/2024   AST 17 05/03/2024   NA 139 05/03/2024   K 4.7 05/03/2024   CL 100 05/03/2024   CREATININE 0.74 05/03/2024   BUN 12 05/03/2024   CO2 33 (H) 05/03/2024   TSH 0.68 05/03/2024   HGBA1C 4.7 02/17/2023    No results found.  Assessment & Plan:  .Impaired fasting glucose Assessment & Plan: She has had elevated a1c in the past,  As high as 5.7, but has normalized A1c with weight loss  Lab Results  Component Value Date   HGBA1C 4.7 02/17/2023     Orders: -     Comprehensive metabolic panel with GFR  Dyslipidemia -     Lipid panel -     LDL cholesterol, direct  Other fatigue -     CBC with Differential/Platelet -     TSH  Obesity (BMI 30.0-34.9) Assessment & Plan: She has lost 46 lbs using Ozempic  and has reached her desired  target weight of 168 lbs.  She is exercising regularly vigourously,  including kickboxing once a week. She is interested in maintaining her current weight  using Wegovy  long term .  Since she is paying out of pocket , will continue prescribing the higher dose and mainpulating the pen to give a lower dose  Orders: -     Semaglutide  (2 MG/DOSE); Inject 2 mg as directed once a week.  Dispense: 3 mL; Refill: 5  Functional diarrhea -     Magnesium  Gastroesophageal reflux disease without esophagitis Assessment & Plan: Contine daily PPI  Orders: -     Pantoprazole  Sodium; Take 1 tablet (40 mg total) by mouth daily.  Dispense: 90 tablet; Refill: 1  Need for Tdap vaccination -     Tdap vaccine greater than or equal to  7yo IM  B12 deficiency Assessment & Plan: Likely secondary to chronic use of PPI.  Continue supplementing parenterally per patient preference  Lab Results  Component Value Date   VITAMINB12 543 09/29/2021      Encounter for weight management Assessment & Plan: She has lost 46 lbs using Ozempic  and has reached her desired  target weight of 168 lbs.  She is exercising regularly vigourously,  including kickboxing once a week. She is interested in maintaining her current weight  using Wegovy  long term .  Since she is paying out of pocket , will continue prescribing the higher dose and mainpulating the pen to give a lower dose     Follow-up: Return in about 6 months (around 10/31/2024).   Verneita LITTIE Kettering, MD

## 2024-05-03 NOTE — Telephone Encounter (Signed)
 Pharmacy Patient Advocate Encounter  Insurance verification completed.   The patient is insured through [facility]   Ran test claim for Ozempic  (2 MG/DOSE) 8MG /3ML pen-injectors.  OUTCOME DENIED not under formulary.  This test claim was processed through [facility]- copay amounts may vary at other pharmacies due to pharmacy/plan contracts, or as the patient moves through the different stages of their insurance plan.

## 2024-05-05 ENCOUNTER — Ambulatory Visit: Payer: Self-pay | Admitting: Internal Medicine

## 2024-05-05 NOTE — Assessment & Plan Note (Addendum)
 She has had elevated a1c in the past,  As high as 5.7, but has normalized A1c with weight loss  Lab Results  Component Value Date   HGBA1C 4.7 02/17/2023

## 2024-05-05 NOTE — Assessment & Plan Note (Signed)
 She has lost 46 lbs using Ozempic  and has reached her desired  target weight of 168 lbs.  She is exercising regularly vigourously,  including kickboxing once a week. She is interested in maintaining her current weight  using Wegovy  long term .  Since she is paying out of pocket , will continue prescribing the higher dose and mainpulating the pen to give a lower dose

## 2024-05-05 NOTE — Assessment & Plan Note (Signed)
 Contine daily PPI

## 2024-05-05 NOTE — Assessment & Plan Note (Signed)
 Likely secondary to chronic use of PPI.  Continue supplementing parenterally per patient preference  Lab Results  Component Value Date   VITAMINB12 543 09/29/2021

## 2024-05-06 ENCOUNTER — Other Ambulatory Visit (HOSPITAL_COMMUNITY): Payer: Self-pay

## 2024-05-06 ENCOUNTER — Other Ambulatory Visit: Payer: Self-pay | Admitting: Internal Medicine

## 2024-05-06 DIAGNOSIS — E66811 Obesity, class 1: Secondary | ICD-10-CM

## 2024-05-06 NOTE — Telephone Encounter (Signed)
 noted

## 2024-05-24 ENCOUNTER — Other Ambulatory Visit: Payer: Self-pay | Admitting: Internal Medicine

## 2024-05-24 DIAGNOSIS — J0101 Acute recurrent maxillary sinusitis: Secondary | ICD-10-CM

## 2024-05-28 ENCOUNTER — Other Ambulatory Visit (HOSPITAL_COMMUNITY): Payer: Self-pay

## 2024-05-30 ENCOUNTER — Other Ambulatory Visit (HOSPITAL_COMMUNITY): Payer: Self-pay

## 2024-06-13 ENCOUNTER — Telehealth: Payer: Self-pay | Admitting: Adult Health

## 2024-06-13 ENCOUNTER — Ambulatory Visit: Admitting: Adult Health

## 2024-06-13 NOTE — Telephone Encounter (Signed)
 LVM and sent mychart msg informing pt of appt change- NP out.

## 2024-06-17 ENCOUNTER — Ambulatory Visit: Admitting: Adult Health

## 2024-06-20 ENCOUNTER — Ambulatory Visit: Admitting: Adult Health

## 2024-06-20 VITALS — BP 99/67 | HR 78

## 2024-06-20 DIAGNOSIS — G43711 Chronic migraine without aura, intractable, with status migrainosus: Secondary | ICD-10-CM | POA: Diagnosis not present

## 2024-06-20 MED ORDER — ONABOTULINUMTOXINA 200 UNITS IJ SOLR
155.0000 [IU] | Freq: Once | INTRAMUSCULAR | Status: AC
  Administered 2024-06-20: 155 [IU] via INTRAMUSCULAR

## 2024-06-20 NOTE — Progress Notes (Signed)
 Update 06/20/2024 JM: returns for repeat Botox , prior injection 03/19/2024.  Reports continued benefit with botox  injections with >50% migraine reduction. She has been having some tension headaches but believes more due to work stress. Has been using Excedrin Migraine with benefit.  Denied benefit previously with Ubrelvy . Tolerated procedure well today. Will return in 3 months for repeat injections.   Meds tried > 2 months: tylenol , magnesium, zofran , prednisone , phenergan , gabapentin,amitriptyline, topiramate, BP medications are contraindicated due to hypotension usually runs systolic about 100 so cannot take propranolol. Sumatriptan.  Rizatriptan .  Nurtec, Ubrelvy , Qulipta, Aimovig contraindicated due to constipation. Ajovy  (rash), hesitant to trial Emgality due to prior rash with Ajovy       Consent Form Botulism Toxin Injection For Chronic Migraine    Reviewed orally with patient, additionally signature is on file:  Botulism toxin has been approved by the Federal drug administration for treatment of chronic migraine. Botulism toxin does not cure chronic migraine and it may not be effective in some patients.  The administration of botulism toxin is accomplished by injecting a small amount of toxin into the muscles of the neck and head. Dosage must be titrated for each individual. Any benefits resulting from botulism toxin tend to wear off after 3 months with a repeat injection required if benefit is to be maintained. Injections are usually done every 3-4 months with maximum effect peak achieved by about 2 or 3 weeks. Botulism toxin is expensive and you should be sure of what costs you will incur resulting from the injection.  The side effects of botulism toxin use for chronic migraine may include:   -Transient, and usually mild, facial weakness with facial injections  -Transient, and usually mild, head or neck weakness with head/neck injections  -Reduction or loss of forehead facial  animation due to forehead muscle weakness  -Eyelid drooping  -Dry eye  -Pain at the site of injection or bruising at the site of injection  -Double vision  -Potential unknown long term risks   Contraindications: You should not have Botox  if you are pregnant, nursing, allergic to albumin, have an infection, skin condition, or muscle weakness at the site of the injection, or have myasthenia gravis, Lambert-Eaton syndrome, or ALS.  It is also possible that as with any injection, there may be an allergic reaction or no effect from the medication. Reduced effectiveness after repeated injections is sometimes seen and rarely infection at the injection site may occur. All care will be taken to prevent these side effects. If therapy is given over a long time, atrophy and wasting in the muscle injected may occur. Occasionally the patient's become refractory to treatment because they develop antibodies to the toxin. In this event, therapy needs to be modified.  I have read the above information and consent to the administration of botulism toxin.    BOTOX  PROCEDURE NOTE FOR MIGRAINE HEADACHE  Contraindications and precautions discussed with patient(above). Aseptic procedure was observed and patient tolerated procedure. Procedure performed by Harlene Bogaert, AGNP-BC.   The condition has existed for more than 6 months, and pt does not have a diagnosis of ALS, Myasthenia Gravis or Lambert-Eaton Syndrome.  Risks and benefits of injections discussed and pt agrees to proceed with the procedure.  Written consent obtained  These injections are medically necessary. Pt  receives good benefits from these injections. These injections do not cause sedations or hallucinations which the oral therapies may cause.   Description of procedure:  The patient was placed in a sitting  position. The standard protocol was used for Botox  as follows, with 5 units of Botox  injected at each site:  -Procerus muscle, midline  injection  -Corrugator muscle, bilateral injection  -Frontalis muscle, bilateral injection, with 2 sites each side, medial injection was performed in the upper one third of the frontalis muscle, in the region vertical from the medial inferior edge of the superior orbital rim. The lateral injection was again in the upper one third of the forehead vertically above the lateral limbus of the cornea, 1.5 cm lateral to the medial injection site.  -Temporalis muscle injection, 4 sites, bilaterally. The first injection was 3 cm above the tragus of the ear, second injection site was 1.5 cm to 3 cm up from the first injection site in line with the tragus of the ear. The third injection site was 1.5-3 cm forward between the first 2 injection sites. The fourth injection site was 1.5 cm posterior to the second injection site. 5th site laterally in the temporalis  muscleat the level of the outer canthus.  -Occipitalis muscle injection, 3 sites, bilaterally. The first injection was done one half way between the occipital protuberance and the tip of the mastoid process behind the ear. The second injection site was done lateral and superior to the first, 1 fingerbreadth from the first injection. The third injection site was 1 fingerbreadth superiorly and medially from the first injection site.  -Cervical paraspinal muscle injection, 2 sites, bilaterally. The first injection site was 1 cm from the midline of the cervical spine, 3 cm inferior to the lower border of the occipital protuberance. The second injection site was 1.5 cm superiorly and laterally to the first injection site.  -Trapezius muscle injection was performed at 3 sites, bilaterally. The first injection site was in the upper trapezius muscle halfway between the inflection point of the neck, and the acromion. The second injection site was one half way between the acromion and the first injection site. The third injection was done between the first injection  site and the inflection point of the neck.       A total of 200 units of Botox  was prepared, 155 units of Botox  was injected as documented above, any Botox  not injected was wasted. The patient tolerated the procedure well, there were no complications of the above procedure.   Harlene Bogaert, AGNP-BC  [facility] [address] [place], [place] [address]  Phone [phone] Fax [phone] Note: This document was prepared with digital dictation and possible smart phrase technology. Any transcriptional errors that result from this process are unintentional.

## 2024-06-20 NOTE — Progress Notes (Signed)
 Botox - 200 units x 1 vial Lot: [id number] Expiration: 2027/06 NDC: 0023-3921-02  Bacteriostatic 0.9% Sodium Chloride - 4 mL  Lot: [id number] Expiration: 06/07/25 NDC: 9590803397  Dx: H56.288  S/P Witnessed by JINNY ROYS

## 2024-07-22 ENCOUNTER — Telehealth: Payer: Self-pay | Admitting: Adult Health

## 2024-07-22 NOTE — Telephone Encounter (Signed)
 Auth#: [id number] (07/22/24-07/22/25)  Pt will continue to fill through [facility].

## 2024-08-23 ENCOUNTER — Other Ambulatory Visit: Payer: Self-pay | Admitting: Adult Health

## 2024-08-23 DIAGNOSIS — G43711 Chronic migraine without aura, intractable, with status migrainosus: Secondary | ICD-10-CM

## 2024-08-26 ENCOUNTER — Other Ambulatory Visit: Payer: Self-pay

## 2024-08-26 DIAGNOSIS — G43711 Chronic migraine without aura, intractable, with status migrainosus: Secondary | ICD-10-CM

## 2024-08-26 NOTE — Telephone Encounter (Signed)
 Re ordered

## 2024-08-26 NOTE — Telephone Encounter (Signed)
Please send Botox refills to [facility], thank you.

## 2024-08-26 NOTE — Telephone Encounter (Signed)
 You can reorder this. I do not see where it is pended. Thank you.

## 2024-08-26 NOTE — Telephone Encounter (Signed)
 Did you already send this? I see where it was pended.

## 2024-09-12 ENCOUNTER — Ambulatory Visit: Admitting: Adult Health

## 2024-09-12 ENCOUNTER — Encounter: Payer: Self-pay | Admitting: Adult Health

## 2024-09-12 VITALS — BP 119/56 | HR 79

## 2024-09-12 DIAGNOSIS — G43711 Chronic migraine without aura, intractable, with status migrainosus: Secondary | ICD-10-CM

## 2024-09-12 MED ORDER — ONABOTULINUMTOXINA 200 UNITS IJ SOLR
155.0000 [IU] | Freq: Once | INTRAMUSCULAR | Status: AC
  Administered 2024-09-12: 155 [IU] via INTRAMUSCULAR

## 2024-09-12 NOTE — Progress Notes (Signed)
 Botox - 200 units x 1 vial Lot: [id number] Expiration: 2027/11 NDC: 0023-3921-03  Bacteriostatic 0.9% Sodium Chloride - 4mL total Onu:[id number] Expiration: 2026-OCT-31 NDC: 9590-8033-97  Dx: H56.288 Sample  Witnessed by: Sherrod BIRCH, RMA

## 2024-09-12 NOTE — Progress Notes (Signed)
 "    Update 09/12/2024 JM: returns for repeat Botox , prior injection 06/20/2024.  Reports continued benefit with botox  injections with >50% migraine reduction. Noted improvement of migraines since prior visit. Has been using Excedrin Migraine with benefit. Tolerated procedure well today. Will return in 3 months for repeat injections.      Consent Form Botulism Toxin Injection For Chronic Migraine    Reviewed orally with patient, additionally signature is on file:  Botulism toxin has been approved by the Federal drug administration for treatment of chronic migraine. Botulism toxin does not cure chronic migraine and it may not be effective in some patients.  The administration of botulism toxin is accomplished by injecting a small amount of toxin into the muscles of the neck and head. Dosage must be titrated for each individual. Any benefits resulting from botulism toxin tend to wear off after 3 months with a repeat injection required if benefit is to be maintained. Injections are usually done every 3-4 months with maximum effect peak achieved by about 2 or 3 weeks. Botulism toxin is expensive and you should be sure of what costs you will incur resulting from the injection.  The side effects of botulism toxin use for chronic migraine may include:   -Transient, and usually mild, facial weakness with facial injections  -Transient, and usually mild, head or neck weakness with head/neck injections  -Reduction or loss of forehead facial animation due to forehead muscle weakness  -Eyelid drooping  -Dry eye  -Pain at the site of injection or bruising at the site of injection  -Double vision  -Potential unknown long term risks   Contraindications: You should not have Botox  if you are pregnant, nursing, allergic to albumin, have an infection, skin condition, or muscle weakness at the site of the injection, or have myasthenia gravis, Lambert-Eaton syndrome, or ALS.  It is also possible that as  with any injection, there may be an allergic reaction or no effect from the medication. Reduced effectiveness after repeated injections is sometimes seen and rarely infection at the injection site may occur. All care will be taken to prevent these side effects. If therapy is given over a long time, atrophy and wasting in the muscle injected may occur. Occasionally the patient's become refractory to treatment because they develop antibodies to the toxin. In this event, therapy needs to be modified.  I have read the above information and consent to the administration of botulism toxin.    BOTOX  PROCEDURE NOTE FOR MIGRAINE HEADACHE  Contraindications and precautions discussed with patient(above). Aseptic procedure was observed and patient tolerated procedure. Procedure performed by Harlene Bogaert, AGNP-BC.   The condition has existed for more than 6 months, and pt does not have a diagnosis of ALS, Myasthenia Gravis or Lambert-Eaton Syndrome.  Risks and benefits of injections discussed and pt agrees to proceed with the procedure.  Written consent obtained  These injections are medically necessary. Pt  receives good benefits from these injections. These injections do not cause sedations or hallucinations which the oral therapies may cause.   Description of procedure:  The patient was placed in a sitting position. The standard protocol was used for Botox  as follows, with 5 units of Botox  injected at each site:  -Procerus muscle, midline injection  -Corrugator muscle, bilateral injection  -Frontalis muscle, bilateral injection, with 2 sites each side, medial injection was performed in the upper one third of the frontalis muscle, in the region vertical from the medial inferior edge of the superior orbital rim.  The lateral injection was again in the upper one third of the forehead vertically above the lateral limbus of the cornea, 1.5 cm lateral to the medial injection site.  -Temporalis muscle  injection, 4 sites, bilaterally. The first injection was 3 cm above the tragus of the ear, second injection site was 1.5 cm to 3 cm up from the first injection site in line with the tragus of the ear. The third injection site was 1.5-3 cm forward between the first 2 injection sites. The fourth injection site was 1.5 cm posterior to the second injection site. 5th site laterally in the temporalis  muscleat the level of the outer canthus.  -Occipitalis muscle injection, 3 sites, bilaterally. The first injection was done one half way between the occipital protuberance and the tip of the mastoid process behind the ear. The second injection site was done lateral and superior to the first, 1 fingerbreadth from the first injection. The third injection site was 1 fingerbreadth superiorly and medially from the first injection site.  -Cervical paraspinal muscle injection, 2 sites, bilaterally. The first injection site was 1 cm from the midline of the cervical spine, 3 cm inferior to the lower border of the occipital protuberance. The second injection site was 1.5 cm superiorly and laterally to the first injection site.  -Trapezius muscle injection was performed at 3 sites, bilaterally. The first injection site was in the upper trapezius muscle halfway between the inflection point of the neck, and the acromion. The second injection site was one half way between the acromion and the first injection site. The third injection was done between the first injection site and the inflection point of the neck.       A total of 200 units of Botox  was prepared, 155 units of Botox  was injected as documented above, any Botox  not injected was wasted. The patient tolerated the procedure well, there were no complications of the above procedure.   Harlene Bogaert, AGNP-BC  [facility] [address] [place], [place] [address]  Phone [phone] Fax [phone] Note: This document was  prepared with digital dictation and possible smart phrase technology. Any transcriptional errors that result from this process are unintentional.    "

## 2024-10-31 ENCOUNTER — Ambulatory Visit: Admitting: Internal Medicine

## 2024-12-16 ENCOUNTER — Ambulatory Visit: Admitting: Adult Health
